# Patient Record
Sex: Female | Born: 1950 | Race: White | Hispanic: No | Marital: Married | Smoking: Former smoker
Health system: Southern US, Community
[De-identification: ages and names within clinical notes are randomized; demographics above are authoritative.]

## PROBLEM LIST (undated history)

## (undated) DIAGNOSIS — I639 Cerebral infarction, unspecified: Secondary | ICD-10-CM

## (undated) DIAGNOSIS — E785 Hyperlipidemia, unspecified: Secondary | ICD-10-CM

## (undated) DIAGNOSIS — I10 Essential (primary) hypertension: Secondary | ICD-10-CM

## (undated) DIAGNOSIS — H34219 Partial retinal artery occlusion, unspecified eye: Secondary | ICD-10-CM

## (undated) DIAGNOSIS — M5416 Radiculopathy, lumbar region: Secondary | ICD-10-CM

## (undated) DIAGNOSIS — I2510 Atherosclerotic heart disease of native coronary artery without angina pectoris: Secondary | ICD-10-CM

## (undated) DIAGNOSIS — M8580 Other specified disorders of bone density and structure, unspecified site: Secondary | ICD-10-CM

## (undated) DIAGNOSIS — N183 Chronic kidney disease, stage 3 unspecified: Secondary | ICD-10-CM

## (undated) DIAGNOSIS — M353 Polymyalgia rheumatica: Secondary | ICD-10-CM

## (undated) DIAGNOSIS — B029 Zoster without complications: Secondary | ICD-10-CM

## (undated) HISTORY — DX: Essential (primary) hypertension: I10

## (undated) HISTORY — DX: Other specified disorders of bone density and structure, unspecified site: M85.80

## (undated) HISTORY — DX: Cerebral infarction, unspecified: I63.9

## (undated) HISTORY — DX: Atherosclerotic heart disease of native coronary artery without angina pectoris: I25.10

## (undated) HISTORY — DX: Radiculopathy, lumbar region: M54.16

## (undated) HISTORY — DX: Partial retinal artery occlusion, unspecified eye: H34.219

## (undated) HISTORY — DX: Polymyalgia rheumatica: M35.3

## (undated) HISTORY — DX: Chronic kidney disease, stage 3 (moderate): N18.3

## (undated) HISTORY — DX: Zoster without complications: B02.9

## (undated) HISTORY — DX: Hyperlipidemia, unspecified: E78.5

## (undated) HISTORY — DX: Chronic kidney disease, stage 3 unspecified: N18.30

---

## 1998-05-05 ENCOUNTER — Other Ambulatory Visit: Admission: RE | Admit: 1998-05-05 | Discharge: 1998-05-05 | Payer: Self-pay | Admitting: *Deleted

## 1998-05-08 ENCOUNTER — Ambulatory Visit (HOSPITAL_COMMUNITY): Admission: RE | Admit: 1998-05-08 | Discharge: 1998-05-08 | Payer: Self-pay | Admitting: Gastroenterology

## 1998-05-22 ENCOUNTER — Other Ambulatory Visit: Admission: RE | Admit: 1998-05-22 | Discharge: 1998-05-22 | Payer: Self-pay | Admitting: *Deleted

## 1998-05-25 ENCOUNTER — Inpatient Hospital Stay (HOSPITAL_COMMUNITY): Admission: RE | Admit: 1998-05-25 | Discharge: 1998-05-27 | Payer: Self-pay | Admitting: *Deleted

## 1998-12-19 ENCOUNTER — Emergency Department (HOSPITAL_COMMUNITY): Admission: EM | Admit: 1998-12-19 | Discharge: 1998-12-19 | Payer: Self-pay | Admitting: Emergency Medicine

## 1998-12-23 ENCOUNTER — Ambulatory Visit (HOSPITAL_COMMUNITY): Admission: RE | Admit: 1998-12-23 | Discharge: 1998-12-23 | Payer: Self-pay | Admitting: Gastroenterology

## 1999-08-03 ENCOUNTER — Encounter: Payer: Self-pay | Admitting: Family Medicine

## 1999-08-03 ENCOUNTER — Ambulatory Visit (HOSPITAL_COMMUNITY): Admission: RE | Admit: 1999-08-03 | Discharge: 1999-08-03 | Payer: Self-pay | Admitting: Family Medicine

## 1999-10-08 ENCOUNTER — Other Ambulatory Visit: Admission: RE | Admit: 1999-10-08 | Discharge: 1999-10-08 | Payer: Self-pay | Admitting: *Deleted

## 1999-10-12 ENCOUNTER — Encounter (INDEPENDENT_AMBULATORY_CARE_PROVIDER_SITE_OTHER): Payer: Self-pay | Admitting: *Deleted

## 1999-10-12 ENCOUNTER — Ambulatory Visit (HOSPITAL_COMMUNITY): Admission: RE | Admit: 1999-10-12 | Discharge: 1999-10-12 | Payer: Self-pay | Admitting: Gastroenterology

## 2000-09-19 ENCOUNTER — Encounter: Admission: RE | Admit: 2000-09-19 | Discharge: 2000-09-19 | Payer: Self-pay | Admitting: Gastroenterology

## 2000-09-19 ENCOUNTER — Encounter: Payer: Self-pay | Admitting: Gastroenterology

## 2000-12-16 ENCOUNTER — Ambulatory Visit (HOSPITAL_COMMUNITY): Admission: RE | Admit: 2000-12-16 | Discharge: 2000-12-16 | Payer: Self-pay | Admitting: Neurology

## 2000-12-16 ENCOUNTER — Encounter: Payer: Self-pay | Admitting: Neurology

## 2001-06-29 ENCOUNTER — Other Ambulatory Visit: Admission: RE | Admit: 2001-06-29 | Discharge: 2001-06-29 | Payer: Self-pay | Admitting: *Deleted

## 2002-05-09 ENCOUNTER — Encounter: Payer: Self-pay | Admitting: Neurology

## 2002-05-09 ENCOUNTER — Encounter: Admission: RE | Admit: 2002-05-09 | Discharge: 2002-05-09 | Payer: Self-pay | Admitting: Neurology

## 2009-02-28 ENCOUNTER — Emergency Department (HOSPITAL_COMMUNITY): Admission: EM | Admit: 2009-02-28 | Discharge: 2009-02-28 | Payer: Self-pay | Admitting: Emergency Medicine

## 2010-02-25 ENCOUNTER — Other Ambulatory Visit: Admission: RE | Admit: 2010-02-25 | Discharge: 2010-02-25 | Payer: Self-pay | Admitting: Obstetrics and Gynecology

## 2010-10-01 ENCOUNTER — Emergency Department (HOSPITAL_COMMUNITY): Admission: EM | Admit: 2010-10-01 | Discharge: 2010-10-01 | Payer: Self-pay | Admitting: Emergency Medicine

## 2011-02-18 ENCOUNTER — Other Ambulatory Visit: Payer: Self-pay | Admitting: Gastroenterology

## 2011-03-17 LAB — COMPREHENSIVE METABOLIC PANEL WITH GFR
ALT: 22 U/L (ref 0–35)
AST: 23 U/L (ref 0–37)
Albumin: 4.2 g/dL (ref 3.5–5.2)
CO2: 25 meq/L (ref 19–32)
Calcium: 9.7 mg/dL (ref 8.4–10.5)
Chloride: 96 meq/L (ref 96–112)
GFR calc Af Amer: >60 mL/min (ref >60)
GFR calc non Af Amer: >60 mL/min (ref >60)
Sodium: 130 meq/L — ABNORMAL LOW (ref 135–145)

## 2011-03-17 LAB — DIFFERENTIAL
Basophils Absolute: 0 10*3/uL (ref 0.0–0.1)
Basophils Relative: 0 % (ref 0–1)
Eosinophils Absolute: 0.1 K/uL (ref 0.0–0.7)
Eosinophils Relative: 1 % (ref 0–5)
Lymphocytes Relative: 15 % (ref 12–46)
Lymphs Abs: 1.9 K/uL (ref 0.7–4.0)
Monocytes Absolute: 0.6 K/uL (ref 0.1–1.0)
Monocytes Relative: 5 % (ref 3–12)
Neutro Abs: 9.9 10*3/uL — ABNORMAL HIGH (ref 1.7–7.7)
Neutrophils Relative %: 79 % — ABNORMAL HIGH (ref 43–77)

## 2011-03-17 LAB — GLUCOSE, CAPILLARY: Glucose-Capillary: 194 mg/dL — ABNORMAL HIGH (ref 70–99)

## 2011-03-17 LAB — CBC
HCT: 45.6 % (ref 36.0–46.0)
Hemoglobin: 15.4 g/dL — ABNORMAL HIGH (ref 12.0–15.0)
MCHC: 33.7 g/dL (ref 30.0–36.0)
MCV: 94.5 fL (ref 78.0–100.0)
Platelets: 313 10*3/uL (ref 150–400)
RBC: 4.82 MIL/uL (ref 3.87–5.11)
RDW: 13.3 % (ref 11.5–15.5)
WBC: 12.5 K/uL — ABNORMAL HIGH (ref 4.0–10.5)

## 2011-03-17 LAB — COMPREHENSIVE METABOLIC PANEL
Alkaline Phosphatase: 80 U/L (ref 39–117)
BUN: 9 mg/dL (ref 6–23)
Creatinine, Ser: 0.65 mg/dL (ref 0.4–1.2)
Glucose, Bld: 169 mg/dL — ABNORMAL HIGH (ref 70–99)
Potassium: 3.9 mEq/L (ref 3.5–5.1)
Total Bilirubin: 0.8 mg/dL (ref 0.3–1.2)
Total Protein: 7.5 g/dL (ref 6.0–8.3)

## 2011-03-17 LAB — URINALYSIS, ROUTINE W REFLEX MICROSCOPIC
Bilirubin Urine: NEGATIVE
Glucose, UA: NEGATIVE mg/dL
Hgb urine dipstick: NEGATIVE
Ketones, ur: NEGATIVE mg/dL
Nitrite: NEGATIVE
Protein, ur: NEGATIVE mg/dL
Specific Gravity, Urine: 1.007 (ref 1.005–1.030)
Urobilinogen, UA: 0.2 mg/dL (ref 0.0–1.0)
pH: 7.5 (ref 5.0–8.0)

## 2011-03-17 LAB — LIPASE, BLOOD: Lipase: 10 U/L — ABNORMAL LOW (ref 11–59)

## 2011-04-22 NOTE — Procedures (Signed)
[place]  Patient:    Yolanda Lopez                  MRN: [id number] Proc. Date: 10/12/99 Adm. Date:  [id number] Attending:  Rich Brave CC:         Stacie Acres. Cliffton Asters, M.D.                           Procedure Report  PROCEDURE PERFORMED:  Colonoscopy with biopsies.  ENDOSCOPIST:  Florencia Reasons, M.D.  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  FINDINGS:  Mild proctitis present.  DESCRIPTION OF PROCEDURE:  The nature, purpose and risks of the procedure were familiar to the patient who provided written consent.  Sedation was fentanyl 100 mcg and Versed 14 mg IV without arrhythmias or desaturation.  The Olympus PCF 140L pediatric video colonoscope was inserted and advanced with some difficulty through an angulated rectosigmoid region and from  there quite easily around the colon to the cecum, experiencing some looping which was overcome by external abdominal compression.  The cecum was reached and brief attempts to enter the terminal ileum were unsuccessful so pull-back was then performed.  The quality of the prep was excellent, so it is felt that all areas were well seen. The only abnormality on this exam was mild proctitis up to about 23 cm, characterized by loss of vascularity and mild mucosal edema and granularity, but no exudate, no pinpoint hemorrhages, no significant erythema.  Biopsies were obtained from the inflamed segment.  Above 23 cm, colonic mucosa had a completely normal  appearance, with a good vascular pattern.  No polyps, cancer or proximal colitis were observed.  No vascular malformations were seen.  The patient tolerated the procedure well and there were no apparent complications. Retroflexion was not performed in the rectum.  IMPRESSION:  Mild  active proctitis.  PLAN:  Await pathology on the biopsies. DD:  10/12/99 TD:  10/13/99 Job: [id number]

## 2013-06-24 ENCOUNTER — Other Ambulatory Visit: Payer: Self-pay | Admitting: Pain Medicine

## 2013-06-24 DIAGNOSIS — M542 Cervicalgia: Secondary | ICD-10-CM

## 2013-07-23 ENCOUNTER — Other Ambulatory Visit: Payer: Self-pay

## 2013-07-30 ENCOUNTER — Other Ambulatory Visit: Payer: Self-pay

## 2013-08-16 ENCOUNTER — Other Ambulatory Visit: Payer: Self-pay | Admitting: Neurology

## 2013-08-16 DIAGNOSIS — G902 Horner's syndrome: Secondary | ICD-10-CM

## 2013-08-22 ENCOUNTER — Ambulatory Visit
Admission: RE | Admit: 2013-08-22 | Discharge: 2013-08-22 | Disposition: A | Discharge status: Home / Self Care | Payer: BC Managed Care – PPO | Source: Ambulatory Visit | Attending: Neurology | Admitting: Neurology

## 2013-08-22 DIAGNOSIS — G902 Horner's syndrome: Secondary | ICD-10-CM

## 2014-02-17 ENCOUNTER — Other Ambulatory Visit: Payer: Self-pay | Admitting: Gastroenterology

## 2014-07-26 ENCOUNTER — Encounter: Payer: Self-pay | Admitting: *Deleted

## 2014-09-09 ENCOUNTER — Other Ambulatory Visit: Payer: Self-pay | Admitting: Family Medicine

## 2014-09-09 ENCOUNTER — Ambulatory Visit
Admission: RE | Admit: 2014-09-09 | Discharge: 2014-09-09 | Disposition: A | Discharge status: Home / Self Care | Payer: BC Managed Care – PPO | Source: Ambulatory Visit | Attending: Family Medicine | Admitting: Family Medicine

## 2014-09-09 DIAGNOSIS — J8410 Pulmonary fibrosis, unspecified: Secondary | ICD-10-CM

## 2014-11-13 ENCOUNTER — Telehealth: Payer: Self-pay | Admitting: *Deleted

## 2014-11-13 NOTE — Telephone Encounter (Signed)
[facility] call from [facility] at [facility] asking for verbal order for pt to have a ten-electrosimulator for her low back pain. I went and spoke to Dr. Tanya NonesPickard in reference to this and he has agreed to have it done. [place].

## 2015-05-21 DIAGNOSIS — M791 Myalgia: Secondary | ICD-10-CM | POA: Diagnosis not present

## 2015-06-01 DIAGNOSIS — M797 Fibromyalgia: Secondary | ICD-10-CM | POA: Diagnosis not present

## 2015-06-01 DIAGNOSIS — R109 Unspecified abdominal pain: Secondary | ICD-10-CM | POA: Diagnosis not present

## 2015-06-01 DIAGNOSIS — Z79899 Other long term (current) drug therapy: Secondary | ICD-10-CM | POA: Diagnosis not present

## 2015-06-01 DIAGNOSIS — G894 Chronic pain syndrome: Secondary | ICD-10-CM | POA: Diagnosis not present

## 2015-06-01 DIAGNOSIS — M1990 Unspecified osteoarthritis, unspecified site: Secondary | ICD-10-CM | POA: Diagnosis not present

## 2015-06-01 DIAGNOSIS — M542 Cervicalgia: Secondary | ICD-10-CM | POA: Diagnosis not present

## 2015-06-19 DIAGNOSIS — M791 Myalgia: Secondary | ICD-10-CM | POA: Diagnosis not present

## 2015-06-25 DIAGNOSIS — Z1231 Encounter for screening mammogram for malignant neoplasm of breast: Secondary | ICD-10-CM | POA: Diagnosis not present

## 2015-06-25 DIAGNOSIS — Z01419 Encounter for gynecological examination (general) (routine) without abnormal findings: Secondary | ICD-10-CM | POA: Diagnosis not present

## 2015-06-25 DIAGNOSIS — Z124 Encounter for screening for malignant neoplasm of cervix: Secondary | ICD-10-CM | POA: Diagnosis not present

## 2015-07-10 DIAGNOSIS — K5130 Ulcerative (chronic) rectosigmoiditis without complications: Secondary | ICD-10-CM | POA: Diagnosis not present

## 2015-07-16 DIAGNOSIS — M810 Age-related osteoporosis without current pathological fracture: Secondary | ICD-10-CM | POA: Diagnosis not present

## 2015-07-16 DIAGNOSIS — Z780 Asymptomatic menopausal state: Secondary | ICD-10-CM | POA: Diagnosis not present

## 2015-07-27 DIAGNOSIS — G894 Chronic pain syndrome: Secondary | ICD-10-CM | POA: Diagnosis not present

## 2015-07-27 DIAGNOSIS — M1990 Unspecified osteoarthritis, unspecified site: Secondary | ICD-10-CM | POA: Diagnosis not present

## 2015-07-27 DIAGNOSIS — M542 Cervicalgia: Secondary | ICD-10-CM | POA: Diagnosis not present

## 2015-07-27 DIAGNOSIS — R109 Unspecified abdominal pain: Secondary | ICD-10-CM | POA: Diagnosis not present

## 2015-07-27 DIAGNOSIS — Z79899 Other long term (current) drug therapy: Secondary | ICD-10-CM | POA: Diagnosis not present

## 2015-07-27 DIAGNOSIS — M797 Fibromyalgia: Secondary | ICD-10-CM | POA: Diagnosis not present

## 2015-09-08 DIAGNOSIS — K3184 Gastroparesis: Secondary | ICD-10-CM | POA: Diagnosis not present

## 2015-09-08 DIAGNOSIS — K5130 Ulcerative (chronic) rectosigmoiditis without complications: Secondary | ICD-10-CM | POA: Diagnosis not present

## 2015-09-08 DIAGNOSIS — K219 Gastro-esophageal reflux disease without esophagitis: Secondary | ICD-10-CM | POA: Diagnosis not present

## 2015-09-16 DIAGNOSIS — Z4789 Encounter for other orthopedic aftercare: Secondary | ICD-10-CM | POA: Diagnosis not present

## 2015-09-16 DIAGNOSIS — M1811 Unilateral primary osteoarthritis of first carpometacarpal joint, right hand: Secondary | ICD-10-CM | POA: Diagnosis not present

## 2015-09-16 DIAGNOSIS — M180 Bilateral primary osteoarthritis of first carpometacarpal joints: Secondary | ICD-10-CM | POA: Diagnosis not present

## 2015-10-13 DIAGNOSIS — E559 Vitamin D deficiency, unspecified: Secondary | ICD-10-CM | POA: Diagnosis not present

## 2015-10-27 ENCOUNTER — Emergency Department (HOSPITAL_COMMUNITY)
Admission: EM | Admit: 2015-10-27 | Discharge: 2015-10-27 | Disposition: A | Discharge status: Home / Self Care | Payer: Medicare Other | Attending: Emergency Medicine | Admitting: Emergency Medicine

## 2015-10-27 ENCOUNTER — Emergency Department (HOSPITAL_COMMUNITY): Payer: Medicare Other

## 2015-10-27 ENCOUNTER — Encounter (HOSPITAL_COMMUNITY): Payer: Self-pay | Admitting: Emergency Medicine

## 2015-10-27 DIAGNOSIS — Y998 Other external cause status: Secondary | ICD-10-CM | POA: Insufficient documentation

## 2015-10-27 DIAGNOSIS — Y92009 Unspecified place in unspecified non-institutional (private) residence as the place of occurrence of the external cause: Secondary | ICD-10-CM | POA: Diagnosis not present

## 2015-10-27 DIAGNOSIS — Z8619 Personal history of other infectious and parasitic diseases: Secondary | ICD-10-CM | POA: Diagnosis not present

## 2015-10-27 DIAGNOSIS — E785 Hyperlipidemia, unspecified: Secondary | ICD-10-CM | POA: Diagnosis not present

## 2015-10-27 DIAGNOSIS — W11XXXA Fall on and from ladder, initial encounter: Secondary | ICD-10-CM | POA: Insufficient documentation

## 2015-10-27 DIAGNOSIS — Z8669 Personal history of other diseases of the nervous system and sense organs: Secondary | ICD-10-CM | POA: Diagnosis not present

## 2015-10-27 DIAGNOSIS — Z87891 Personal history of nicotine dependence: Secondary | ICD-10-CM | POA: Diagnosis not present

## 2015-10-27 DIAGNOSIS — M25571 Pain in right ankle and joints of right foot: Secondary | ICD-10-CM | POA: Diagnosis not present

## 2015-10-27 DIAGNOSIS — Z79899 Other long term (current) drug therapy: Secondary | ICD-10-CM | POA: Diagnosis not present

## 2015-10-27 DIAGNOSIS — I129 Hypertensive chronic kidney disease with stage 1 through stage 4 chronic kidney disease, or unspecified chronic kidney disease: Secondary | ICD-10-CM | POA: Diagnosis not present

## 2015-10-27 DIAGNOSIS — N183 Chronic kidney disease, stage 3 (moderate): Secondary | ICD-10-CM | POA: Insufficient documentation

## 2015-10-27 DIAGNOSIS — M8580 Other specified disorders of bone density and structure, unspecified site: Secondary | ICD-10-CM | POA: Insufficient documentation

## 2015-10-27 DIAGNOSIS — Y9389 Activity, other specified: Secondary | ICD-10-CM | POA: Diagnosis not present

## 2015-10-27 DIAGNOSIS — S8261XA Displaced fracture of lateral malleolus of right fibula, initial encounter for closed fracture: Secondary | ICD-10-CM | POA: Diagnosis not present

## 2015-10-27 DIAGNOSIS — S82401A Unspecified fracture of shaft of right fibula, initial encounter for closed fracture: Secondary | ICD-10-CM

## 2015-10-27 DIAGNOSIS — I2510 Atherosclerotic heart disease of native coronary artery without angina pectoris: Secondary | ICD-10-CM | POA: Insufficient documentation

## 2015-10-27 DIAGNOSIS — S99911A Unspecified injury of right ankle, initial encounter: Secondary | ICD-10-CM | POA: Diagnosis present

## 2015-10-27 MED ORDER — ACETAMINOPHEN 500 MG PO TABS
1000.0000 mg | ORAL_TABLET | Freq: Once | ORAL | Status: AC
  Administered 2015-10-27: 1000 mg via ORAL
  Filled 2015-10-27: qty 2

## 2015-10-27 MED ORDER — HYDROCODONE-ACETAMINOPHEN 5-325 MG PO TABS: 1.0000 | ORAL_TABLET | Freq: Four times a day (QID) | ORAL | Status: AC | PRN

## 2015-10-27 NOTE — ED Notes (Signed)
Per EMS: Pt had fall from about 6-7 stairs, landing on right ankle, has swelling and slight deformity to ankle.

## 2015-10-27 NOTE — ED Notes (Signed)
Bed: [id number] Expected date:  Expected time:  Means of arrival:  Comments: EMS 64 yo female from home-fell down stairs-deformity right ankle/Fentanyl IV per EMS

## 2015-10-27 NOTE — Discharge Instructions (Signed)
Please follow up with Orthopedics. You may weight bear as tolerated.  Fibular Ankle Fracture Treated With or Without Immobilization, Adult A fibular fracture at your ankle is a break (fracture) bone in the smallest of the two bones in your lower leg, located on the outside of your leg (fibula) close to the area at your ankle joint. CAUSES  Rolling your ankle.  Twisting your ankle.  Extreme flexing or extending of your foot.  Severe force on your ankle as when falling from a distance. RISK FACTORS  Jumping activities.  Participation in sports.  Osteoporosis.  Advanced age.  Previous ankle injuries. SIGNS AND SYMPTOMS  Pain.  Swelling.  Inability to put weight on injured ankle.  Bruising.  Bone deformities at site of injury. DIAGNOSIS  This fracture is diagnosed with the help of an X-ray exam. TREATMENT  If the fractured bone did not move out of place it usually will heal without problems and does casting or splinting. If immobilization is needed for comfort or the fractured bone moved out of place and will not heal properly with immobilization, a cast or splint will be used. HOME CARE INSTRUCTIONS   Apply ice to the area of injury:  Put ice in a plastic bag.  Place a towel between your skin and the bag.  Leave the ice on for 20 minutes, 2-3 times a day.  Use crutches as directed. Resume walking without crutches as directed by your health care provider.  Only take over-the-counter or prescription medicines for pain, discomfort, or fever as directed by your health care provider.  If you have a removable splint or boot, do not remove the boot unless directed by your health care provider. SEEK MEDICAL CARE IF:   You have continued pain or more swelling  The medications do not control the pain. SEEK IMMEDIATE MEDICAL CARE IF:  You develop severe pain in the leg or foot.  Your skin or nails below the injury turn blue or grey or feel cold or numb. MAKE SURE  YOU:   Understand these instructions.  Will watch your condition.  Will get help right away if you are not doing well or get worse.   This information is not intended to replace advice given to you by your health care provider. Make sure you discuss any questions you have with your health care provider.   Document Released: 11/21/2005 Document Revised: 12/12/2014 Document Reviewed: 07/03/2013 Elsevier Interactive Patient Education 2016 [employer] RICE for Routine Care of Injuries Theroutine careofmanyinjuriesincludes rest, ice, compression, and elevation (RICE therapy). RICE therapy is often recommended for injuries to soft tissues, such as a muscle strain, ligament injuries, bruises, and overuse injuries. It can also be used for some bony injuries. Using RICE therapy can help to relieve pain, lessen swelling, and enable your body to heal. Rest Rest is required to allow your body to heal. This usually involves reducing your normal activities and avoiding use of the injured part of your body. Generally, you can return to your normal activities when you are comfortable and have been given permission by your health care provider. Ice Icing your injury helps to keep the swelling down, and it lessens pain. Do not apply ice directly to your skin. Put ice in a plastic bag. Place a towel between your skin and the bag. Leave the ice on for 20 minutes, 2-3 times a day. Do this for as long as you are directed by your health care provider. Compression Compression means putting pressure on  the injured area. Compression helps to keep swelling down, gives support, and helps with discomfort. Compression may be done with an elastic bandage. If an elastic bandage has been applied, follow these general tips: Remove and reapply the bandage every 3-4 hours or as directed by your health care provider. Make sure the bandage is not wrapped too tightly, because this can cut off circulation. If part of your  body beyond the bandage becomes blue, numb, cold, swollen, or more painful, your bandage is most likely too tight. If this occurs, remove your bandage and reapply it more loosely. See your health care provider if the bandage seems to be making your problems worse rather than better. Elevation Elevation means keeping the injured area raised. This helps to lessen swelling and decrease pain. If possible, your injured area should be elevated at or above the level of your heart or the center of your chest. WHEN SHOULD I SEEK MEDICAL CARE? You should seek medical care if: Your pain and swelling continue. Your symptoms are getting worse rather than improving. These symptoms may indicate that further evaluation or further X-rays are needed. Sometimes, X-rays may not show a small broken bone (fracture) until a number of days later. Make a follow-up appointment with your health care provider. WHEN SHOULD I SEEK IMMEDIATE MEDICAL CARE? You should seek immediate medical care if: You have sudden severe pain at or below the area of your injury. You have redness or increased swelling around your injury. You have tingling or numbness at or below the area of your injury that does not improve after you remove the elastic bandage.   This information is not intended to replace advice given to you by your health care provider. Make sure you discuss any questions you have with your health care provider.   Document Released: 03/05/2001 Document Revised: 08/12/2015 Document Reviewed: 10/29/2014 Elsevier Interactive Patient Education [employer]

## 2015-10-27 NOTE — ED Provider Notes (Signed)
CSN: [id number]     Arrival date & time 10/27/15  1941 History   First MD Initiated Contact with Patient 10/27/15 1952     Chief Complaint  Patient presents with  . Ankle Injury    right     (Consider location/radiation/quality/duration/timing/severity/associated sxs/prior Treatment) HPI Yolanda Lopez is a(n) 64 y.o. female who presents to the emergency department with chief complaint of right ankle pain. Patient states that she was going up a drop down attic ladder when she lost her footing. Her shoe got caught in the ladder and she fell backward dangling from the right ankle. She denies hitting her head or losing consciousness. She immediate severe pain in the ankle. Unable to bear weight. She denies any numbness, tingling. She has previous injuries to this area.  Past Medical History  Diagnosis Date  . Essential hypertension, benign   . CAD (coronary artery disease)   . Other and unspecified hyperlipidemia   . Hollenhorst plaque   . Shingles   . PMR (polymyalgia rheumatica) (HCC)   . BPH (benign prostatic hypertrophy)   . ED (erectile dysfunction)   . Lumbar radiculopathy   . Osteopenia   . CKD (chronic kidney disease), stage III   . Occipital stroke [facility](HCC)    History reviewed. No pertinent past surgical history. History reviewed. No pertinent family history. Social History  Substance Use Topics  . Smoking status: Former Games developermoker  . Smokeless tobacco: None     Comment: quit 1965  . Alcohol Use: Yes     Comment: occassionally   OB History    No data available     Review of Systems   Ten systems reviewed and are negative for acute change, except as noted in the HPI.   Allergies  Voltaren; Adhesive; Codeine; Meloxicam; Norvasc; Other; Prinivil; and Vioxx  Home Medications   Prior to Admission medications   Medication Sig Start Date End Date Taking? Authorizing Provider  benazepril (LOTENSIN) 40 MG tablet Take 40 mg by mouth daily.    Historical Provider, MD   Cholecalciferol (VITAMIN D3) 5000 UNITS CAPS Take 1 capsule by mouth daily.    Historical Provider, MD  colesevelam (WELCHOL) 625 MG tablet Take 1,875 mg by mouth 2 (two) times daily with a meal.    Historical Provider, MD  dexlansoprazole (DEXILANT) 60 MG capsule Take 60 mg by mouth daily.    Historical Provider, MD  DULoxetine (CYMBALTA) 60 MG capsule Take 60 mg by mouth daily.    Historical Provider, MD  glimepiride (AMARYL) 4 MG tablet Take 4 mg by mouth daily with breakfast.    Historical Provider, MD  Magnesium 100 MG TABS Take 1 tablet by mouth daily.    Historical Provider, MD  Mesalamine (DELZICOL) 400 MG CPDR DR capsule Take by mouth. Take 6 capsules by mouth daily    Historical Provider, MD  pyridOXINE (VITAMIN B-6) 100 MG tablet Take 100 mg by mouth daily.    Historical Provider, MD  saxagliptin HCl (ONGLYZA) 5 MG TABS tablet Take 5 mg by mouth daily.    Historical Provider, MD  Tapentadol HCl (NUCYNTA) 100 MG TABS Take 1 tablet by mouth 2 (two) times daily.    Historical Provider, MD  valACYclovir (VALTREX) 1000 MG tablet Take 1,000 mg by mouth 2 (two) times daily.    Historical Provider, MD  vitamin B-12 (CYANOCOBALAMIN) 1000 MCG tablet Take 1,000 mcg by mouth daily.    Historical Provider, MD   BP 132/71 mmHg  Pulse 97  Temp(Src) 98.3 F (36.8 C) (Oral)  Resp 20  SpO2 98% Physical Exam Physical Exam  Nursing note and vitals reviewed. Constitutional: She is oriented to person, place, and time. She appears well-developed and well-nourished. No distress.  HENT:  Head: Normocephalic and atraumatic.  Eyes: Conjunctivae normal and EOM are normal. Pupils are equal, round, and reactive to light. No scleral icterus.  Neck: Normal range of motion.  Cardiovascular: Normal rate, regular rhythm and normal heart sounds.  Exam reveals no gallop and no friction rub.   No murmur heard. Pulmonary/Chest: Effort normal and breath sounds normal. No respiratory distress.  Abdominal: Soft.  Bowel sounds are normal. She exhibits no distension and no mass. There is no tenderness. There is no guarding.  Neurological: She is alert and oriented to person, place, and time. Musculoskeletal: patient with tenderness to palpation and swelling in the R ankle. NVI. Limited ROM. R knee mildle tender with Flexion and extension. Ligaments stable.  Skin: Skin is warm and dry. She is not diaphoretic.    ED Course  Procedures (including critical care time) Labs Review Labs Reviewed - No data to display  Imaging Review Dg Ankle Complete Right  10/27/2015  CLINICAL DATA:  Pt states she was trying to put stuff away in attic today and fell. Right ankle pain/swelling to medial and lateral right ankle. EXAM: RIGHT ANKLE - COMPLETE 3+ VIEW COMPARISON:  None. FINDINGS: Transverse fracture of the lateral malleolus, distracted up to 5 mm, with overlying soft tissue swelling. Ankle mortise is intact. Medial and posterior malleoli appear intact. Otherwise normal mineralization and alignment. No significant osseous degenerative change. IMPRESSION: 1. Minimally distracted lateral malleolar fracture. Electronically Signed   By: Corlis Leak M.D.   On: 10/27/2015 20:18   I have personally reviewed and evaluated these images and lab results as part of my medical decision-making.   EKG Interpretation None      MDM   Final diagnoses:  Fibula fracture, right, closed, initial encounter    Patient with minimally displaed Lateral Malleloar fracture.  Treat with Cam walker and cruthces. F/U with ortho,. I have discussed the case with Dr. Juleen China. WBAT ,Apply a compressive ACE bandage. Rest and elevate the affected painful area.  Apply cold compresses intermittently as needed.  As pain recedes, begin normal activities slowly as tolerated.  Call if symptoms persist. Discussed return precautions        Arthor Captain, PA-C 10/27/15 2149  Raeford Razor, MD 11/07/15 2048

## 2015-10-28 DIAGNOSIS — W11XXXA Fall on and from ladder, initial encounter: Secondary | ICD-10-CM | POA: Diagnosis not present

## 2015-10-28 DIAGNOSIS — S82891D Other fracture of right lower leg, subsequent encounter for closed fracture with routine healing: Secondary | ICD-10-CM | POA: Diagnosis not present

## 2015-11-04 DIAGNOSIS — S8291XA Unspecified fracture of right lower leg, initial encounter for closed fracture: Secondary | ICD-10-CM | POA: Diagnosis not present

## 2015-11-04 DIAGNOSIS — M79671 Pain in right foot: Secondary | ICD-10-CM | POA: Diagnosis not present

## 2015-11-05 ENCOUNTER — Other Ambulatory Visit: Payer: Self-pay | Admitting: Specialist

## 2015-11-05 ENCOUNTER — Telehealth: Payer: Self-pay | Admitting: *Deleted

## 2015-11-06 ENCOUNTER — Ambulatory Visit
Admission: RE | Admit: 2015-11-06 | Discharge: 2015-11-06 | Disposition: A | Discharge status: Home / Self Care | Payer: Medicare Other | Source: Ambulatory Visit | Attending: Specialist | Admitting: Specialist

## 2015-11-06 ENCOUNTER — Other Ambulatory Visit: Payer: Self-pay | Admitting: Specialist

## 2015-11-06 DIAGNOSIS — S8261XS Displaced fracture of lateral malleolus of right fibula, sequela: Secondary | ICD-10-CM

## 2015-11-06 DIAGNOSIS — S92191A Other fracture of right talus, initial encounter for closed fracture: Secondary | ICD-10-CM | POA: Diagnosis not present

## 2015-11-06 DIAGNOSIS — S8261XA Displaced fracture of lateral malleolus of right fibula, initial encounter for closed fracture: Secondary | ICD-10-CM | POA: Diagnosis not present

## 2015-11-06 DIAGNOSIS — R937 Abnormal findings on diagnostic imaging of other parts of musculoskeletal system: Secondary | ICD-10-CM | POA: Diagnosis not present

## 2015-11-09 DIAGNOSIS — S8261XD Displaced fracture of lateral malleolus of right fibula, subsequent encounter for closed fracture with routine healing: Secondary | ICD-10-CM | POA: Diagnosis not present

## 2015-11-09 DIAGNOSIS — S92111A Displaced fracture of neck of right talus, initial encounter for closed fracture: Secondary | ICD-10-CM | POA: Diagnosis not present

## 2015-11-10 DIAGNOSIS — Y92016 Swimming-pool in single-family (private) house or garden as the place of occurrence of the external cause: Secondary | ICD-10-CM | POA: Diagnosis not present

## 2015-11-10 DIAGNOSIS — S8263XA Displaced fracture of lateral malleolus of unspecified fibula, initial encounter for closed fracture: Secondary | ICD-10-CM | POA: Diagnosis not present

## 2015-11-10 DIAGNOSIS — S93311A Subluxation of tarsal joint of right foot, initial encounter: Secondary | ICD-10-CM | POA: Diagnosis not present

## 2015-11-10 DIAGNOSIS — W19XXXA Unspecified fall, initial encounter: Secondary | ICD-10-CM | POA: Diagnosis not present

## 2015-11-10 DIAGNOSIS — S8261XA Displaced fracture of lateral malleolus of right fibula, initial encounter for closed fracture: Secondary | ICD-10-CM | POA: Diagnosis not present

## 2015-11-10 DIAGNOSIS — S9301XA Subluxation of right ankle joint, initial encounter: Secondary | ICD-10-CM | POA: Diagnosis not present

## 2015-11-10 DIAGNOSIS — Y998 Other external cause status: Secondary | ICD-10-CM | POA: Diagnosis not present

## 2015-11-10 DIAGNOSIS — G8918 Other acute postprocedural pain: Secondary | ICD-10-CM | POA: Diagnosis not present

## 2015-11-10 DIAGNOSIS — S92111A Displaced fracture of neck of right talus, initial encounter for closed fracture: Secondary | ICD-10-CM | POA: Diagnosis not present

## 2015-11-16 DIAGNOSIS — S92114D Nondisplaced fracture of neck of right talus, subsequent encounter for fracture with routine healing: Secondary | ICD-10-CM | POA: Diagnosis not present

## 2015-11-16 DIAGNOSIS — Z7984 Long term (current) use of oral hypoglycemic drugs: Secondary | ICD-10-CM | POA: Diagnosis not present

## 2015-11-16 DIAGNOSIS — S8261XD Displaced fracture of lateral malleolus of right fibula, subsequent encounter for closed fracture with routine healing: Secondary | ICD-10-CM | POA: Diagnosis not present

## 2015-11-16 DIAGNOSIS — M6281 Muscle weakness (generalized): Secondary | ICD-10-CM | POA: Diagnosis not present

## 2015-11-16 DIAGNOSIS — W1789XD Other fall from one level to another, subsequent encounter: Secondary | ICD-10-CM | POA: Diagnosis not present

## 2015-11-16 DIAGNOSIS — Z79891 Long term (current) use of opiate analgesic: Secondary | ICD-10-CM | POA: Diagnosis not present

## 2015-11-19 DIAGNOSIS — S92114D Nondisplaced fracture of neck of right talus, subsequent encounter for fracture with routine healing: Secondary | ICD-10-CM | POA: Diagnosis not present

## 2015-11-19 DIAGNOSIS — M6281 Muscle weakness (generalized): Secondary | ICD-10-CM | POA: Diagnosis not present

## 2015-11-19 DIAGNOSIS — W1789XD Other fall from one level to another, subsequent encounter: Secondary | ICD-10-CM | POA: Diagnosis not present

## 2015-11-19 DIAGNOSIS — Z7984 Long term (current) use of oral hypoglycemic drugs: Secondary | ICD-10-CM | POA: Diagnosis not present

## 2015-11-19 DIAGNOSIS — S8261XD Displaced fracture of lateral malleolus of right fibula, subsequent encounter for closed fracture with routine healing: Secondary | ICD-10-CM | POA: Diagnosis not present

## 2015-11-19 DIAGNOSIS — Z79891 Long term (current) use of opiate analgesic: Secondary | ICD-10-CM | POA: Diagnosis not present

## 2015-11-25 DIAGNOSIS — S8261XD Displaced fracture of lateral malleolus of right fibula, subsequent encounter for closed fracture with routine healing: Secondary | ICD-10-CM | POA: Diagnosis not present

## 2015-11-25 DIAGNOSIS — S92111D Displaced fracture of neck of right talus, subsequent encounter for fracture with routine healing: Secondary | ICD-10-CM | POA: Diagnosis not present

## 2015-11-27 DIAGNOSIS — S8261XD Displaced fracture of lateral malleolus of right fibula, subsequent encounter for closed fracture with routine healing: Secondary | ICD-10-CM | POA: Diagnosis not present

## 2015-11-27 DIAGNOSIS — Z79891 Long term (current) use of opiate analgesic: Secondary | ICD-10-CM | POA: Diagnosis not present

## 2015-11-27 DIAGNOSIS — W1789XD Other fall from one level to another, subsequent encounter: Secondary | ICD-10-CM | POA: Diagnosis not present

## 2015-11-27 DIAGNOSIS — M6281 Muscle weakness (generalized): Secondary | ICD-10-CM | POA: Diagnosis not present

## 2015-11-27 DIAGNOSIS — S92114D Nondisplaced fracture of neck of right talus, subsequent encounter for fracture with routine healing: Secondary | ICD-10-CM | POA: Diagnosis not present

## 2015-11-27 DIAGNOSIS — Z7984 Long term (current) use of oral hypoglycemic drugs: Secondary | ICD-10-CM | POA: Diagnosis not present

## 2015-12-03 DIAGNOSIS — M6281 Muscle weakness (generalized): Secondary | ICD-10-CM | POA: Diagnosis not present

## 2015-12-03 DIAGNOSIS — Z79891 Long term (current) use of opiate analgesic: Secondary | ICD-10-CM | POA: Diagnosis not present

## 2015-12-03 DIAGNOSIS — Z7984 Long term (current) use of oral hypoglycemic drugs: Secondary | ICD-10-CM | POA: Diagnosis not present

## 2015-12-03 DIAGNOSIS — S8261XD Displaced fracture of lateral malleolus of right fibula, subsequent encounter for closed fracture with routine healing: Secondary | ICD-10-CM | POA: Diagnosis not present

## 2015-12-03 DIAGNOSIS — W1789XD Other fall from one level to another, subsequent encounter: Secondary | ICD-10-CM | POA: Diagnosis not present

## 2015-12-03 DIAGNOSIS — S92114D Nondisplaced fracture of neck of right talus, subsequent encounter for fracture with routine healing: Secondary | ICD-10-CM | POA: Diagnosis not present

## 2015-12-17 ENCOUNTER — Other Ambulatory Visit: Payer: Self-pay | Admitting: Gastroenterology

## 2015-12-17 ENCOUNTER — Ambulatory Visit
Admission: RE | Admit: 2015-12-17 | Discharge: 2015-12-17 | Disposition: A | Discharge status: Home / Self Care | Payer: Medicare Other | Source: Ambulatory Visit | Attending: Gastroenterology | Admitting: Gastroenterology

## 2015-12-17 DIAGNOSIS — R109 Unspecified abdominal pain: Secondary | ICD-10-CM | POA: Diagnosis not present

## 2015-12-17 DIAGNOSIS — K5900 Constipation, unspecified: Secondary | ICD-10-CM

## 2015-12-22 DIAGNOSIS — H43813 Vitreous degeneration, bilateral: Secondary | ICD-10-CM | POA: Diagnosis not present

## 2015-12-22 DIAGNOSIS — H04123 Dry eye syndrome of bilateral lacrimal glands: Secondary | ICD-10-CM | POA: Diagnosis not present

## 2015-12-22 DIAGNOSIS — H31093 Other chorioretinal scars, bilateral: Secondary | ICD-10-CM | POA: Diagnosis not present

## 2015-12-22 DIAGNOSIS — H2513 Age-related nuclear cataract, bilateral: Secondary | ICD-10-CM | POA: Diagnosis not present

## 2015-12-22 DIAGNOSIS — E119 Type 2 diabetes mellitus without complications: Secondary | ICD-10-CM | POA: Diagnosis not present

## 2015-12-28 DIAGNOSIS — S92111D Displaced fracture of neck of right talus, subsequent encounter for fracture with routine healing: Secondary | ICD-10-CM | POA: Diagnosis not present

## 2015-12-28 DIAGNOSIS — Z4789 Encounter for other orthopedic aftercare: Secondary | ICD-10-CM | POA: Diagnosis not present

## 2015-12-28 DIAGNOSIS — S8261XD Displaced fracture of lateral malleolus of right fibula, subsequent encounter for closed fracture with routine healing: Secondary | ICD-10-CM | POA: Diagnosis not present

## 2016-01-05 DIAGNOSIS — Z7984 Long term (current) use of oral hypoglycemic drugs: Secondary | ICD-10-CM | POA: Diagnosis not present

## 2016-01-05 DIAGNOSIS — E119 Type 2 diabetes mellitus without complications: Secondary | ICD-10-CM | POA: Diagnosis not present

## 2016-01-05 DIAGNOSIS — Z23 Encounter for immunization: Secondary | ICD-10-CM | POA: Diagnosis not present

## 2016-01-12 DIAGNOSIS — S92111D Displaced fracture of neck of right talus, subsequent encounter for fracture with routine healing: Secondary | ICD-10-CM | POA: Diagnosis not present

## 2016-01-12 DIAGNOSIS — S92111A Displaced fracture of neck of right talus, initial encounter for closed fracture: Secondary | ICD-10-CM | POA: Diagnosis not present

## 2016-01-14 DIAGNOSIS — S92111D Displaced fracture of neck of right talus, subsequent encounter for fracture with routine healing: Secondary | ICD-10-CM | POA: Diagnosis not present

## 2016-01-25 DIAGNOSIS — Z4789 Encounter for other orthopedic aftercare: Secondary | ICD-10-CM | POA: Diagnosis not present

## 2016-01-25 DIAGNOSIS — S92111D Displaced fracture of neck of right talus, subsequent encounter for fracture with routine healing: Secondary | ICD-10-CM | POA: Diagnosis not present

## 2016-01-25 DIAGNOSIS — S8261XD Displaced fracture of lateral malleolus of right fibula, subsequent encounter for closed fracture with routine healing: Secondary | ICD-10-CM | POA: Diagnosis not present

## 2016-01-26 DIAGNOSIS — S92111D Displaced fracture of neck of right talus, subsequent encounter for fracture with routine healing: Secondary | ICD-10-CM | POA: Diagnosis not present

## 2016-01-27 DIAGNOSIS — F419 Anxiety disorder, unspecified: Secondary | ICD-10-CM | POA: Diagnosis not present

## 2016-01-27 DIAGNOSIS — K5130 Ulcerative (chronic) rectosigmoiditis without complications: Secondary | ICD-10-CM | POA: Diagnosis not present

## 2016-01-28 DIAGNOSIS — S92111D Displaced fracture of neck of right talus, subsequent encounter for fracture with routine healing: Secondary | ICD-10-CM | POA: Diagnosis not present

## 2016-02-01 DIAGNOSIS — L409 Psoriasis, unspecified: Secondary | ICD-10-CM | POA: Diagnosis not present

## 2016-02-01 DIAGNOSIS — E559 Vitamin D deficiency, unspecified: Secondary | ICD-10-CM | POA: Diagnosis not present

## 2016-02-01 DIAGNOSIS — Z1159 Encounter for screening for other viral diseases: Secondary | ICD-10-CM | POA: Diagnosis not present

## 2016-02-01 DIAGNOSIS — F339 Major depressive disorder, recurrent, unspecified: Secondary | ICD-10-CM | POA: Diagnosis not present

## 2016-02-01 DIAGNOSIS — E785 Hyperlipidemia, unspecified: Secondary | ICD-10-CM | POA: Diagnosis not present

## 2016-02-01 DIAGNOSIS — I10 Essential (primary) hypertension: Secondary | ICD-10-CM | POA: Diagnosis not present

## 2016-02-05 DIAGNOSIS — K5130 Ulcerative (chronic) rectosigmoiditis without complications: Secondary | ICD-10-CM | POA: Diagnosis not present

## 2016-02-08 DIAGNOSIS — S92112D Displaced fracture of neck of left talus, subsequent encounter for fracture with routine healing: Secondary | ICD-10-CM | POA: Diagnosis not present

## 2016-02-08 DIAGNOSIS — S92111D Displaced fracture of neck of right talus, subsequent encounter for fracture with routine healing: Secondary | ICD-10-CM | POA: Diagnosis not present

## 2016-02-10 DIAGNOSIS — I10 Essential (primary) hypertension: Secondary | ICD-10-CM | POA: Diagnosis not present

## 2016-02-10 DIAGNOSIS — E119 Type 2 diabetes mellitus without complications: Secondary | ICD-10-CM | POA: Diagnosis not present

## 2016-02-10 DIAGNOSIS — Z1159 Encounter for screening for other viral diseases: Secondary | ICD-10-CM | POA: Diagnosis not present

## 2016-02-10 DIAGNOSIS — E559 Vitamin D deficiency, unspecified: Secondary | ICD-10-CM | POA: Diagnosis not present

## 2016-02-10 DIAGNOSIS — Z7984 Long term (current) use of oral hypoglycemic drugs: Secondary | ICD-10-CM | POA: Diagnosis not present

## 2016-02-11 DIAGNOSIS — S92111D Displaced fracture of neck of right talus, subsequent encounter for fracture with routine healing: Secondary | ICD-10-CM | POA: Diagnosis not present

## 2016-02-22 DIAGNOSIS — S92111D Displaced fracture of neck of right talus, subsequent encounter for fracture with routine healing: Secondary | ICD-10-CM | POA: Diagnosis not present

## 2016-02-22 DIAGNOSIS — S8261XD Displaced fracture of lateral malleolus of right fibula, subsequent encounter for closed fracture with routine healing: Secondary | ICD-10-CM | POA: Diagnosis not present

## 2016-02-23 DIAGNOSIS — S92111D Displaced fracture of neck of right talus, subsequent encounter for fracture with routine healing: Secondary | ICD-10-CM | POA: Diagnosis not present

## 2016-03-01 DIAGNOSIS — S92111D Displaced fracture of neck of right talus, subsequent encounter for fracture with routine healing: Secondary | ICD-10-CM | POA: Diagnosis not present

## 2016-03-03 DIAGNOSIS — S92111D Displaced fracture of neck of right talus, subsequent encounter for fracture with routine healing: Secondary | ICD-10-CM | POA: Diagnosis not present

## 2016-03-09 DIAGNOSIS — Z4789 Encounter for other orthopedic aftercare: Secondary | ICD-10-CM | POA: Diagnosis not present

## 2016-03-09 DIAGNOSIS — M722 Plantar fascial fibromatosis: Secondary | ICD-10-CM | POA: Diagnosis not present

## 2016-03-09 DIAGNOSIS — S92111D Displaced fracture of neck of right talus, subsequent encounter for fracture with routine healing: Secondary | ICD-10-CM | POA: Diagnosis not present

## 2016-03-15 DIAGNOSIS — S92111D Displaced fracture of neck of right talus, subsequent encounter for fracture with routine healing: Secondary | ICD-10-CM | POA: Diagnosis not present

## 2016-03-17 ENCOUNTER — Encounter: Payer: Self-pay | Admitting: *Deleted

## 2016-03-17 DIAGNOSIS — S92111D Displaced fracture of neck of right talus, subsequent encounter for fracture with routine healing: Secondary | ICD-10-CM | POA: Diagnosis not present

## 2016-03-21 DIAGNOSIS — Z7984 Long term (current) use of oral hypoglycemic drugs: Secondary | ICD-10-CM | POA: Diagnosis not present

## 2016-03-21 DIAGNOSIS — E119 Type 2 diabetes mellitus without complications: Secondary | ICD-10-CM | POA: Diagnosis not present

## 2016-03-29 DIAGNOSIS — S92111D Displaced fracture of neck of right talus, subsequent encounter for fracture with routine healing: Secondary | ICD-10-CM | POA: Diagnosis not present

## 2016-04-01 DIAGNOSIS — Z7984 Long term (current) use of oral hypoglycemic drugs: Secondary | ICD-10-CM | POA: Diagnosis not present

## 2016-04-01 DIAGNOSIS — E1165 Type 2 diabetes mellitus with hyperglycemia: Secondary | ICD-10-CM | POA: Diagnosis not present

## 2016-04-14 DIAGNOSIS — S92111D Displaced fracture of neck of right talus, subsequent encounter for fracture with routine healing: Secondary | ICD-10-CM | POA: Diagnosis not present

## 2016-04-20 DIAGNOSIS — S92111D Displaced fracture of neck of right talus, subsequent encounter for fracture with routine healing: Secondary | ICD-10-CM | POA: Diagnosis not present

## 2016-04-28 DIAGNOSIS — S92111A Displaced fracture of neck of right talus, initial encounter for closed fracture: Secondary | ICD-10-CM | POA: Diagnosis not present

## 2016-05-06 DIAGNOSIS — S92111D Displaced fracture of neck of right talus, subsequent encounter for fracture with routine healing: Secondary | ICD-10-CM | POA: Diagnosis not present

## 2016-05-06 DIAGNOSIS — Z7984 Long term (current) use of oral hypoglycemic drugs: Secondary | ICD-10-CM | POA: Diagnosis not present

## 2016-05-06 DIAGNOSIS — E1165 Type 2 diabetes mellitus with hyperglycemia: Secondary | ICD-10-CM | POA: Diagnosis not present

## 2016-05-18 DIAGNOSIS — L281 Prurigo nodularis: Secondary | ICD-10-CM | POA: Diagnosis not present

## 2016-05-18 DIAGNOSIS — L739 Follicular disorder, unspecified: Secondary | ICD-10-CM | POA: Diagnosis not present

## 2016-05-18 DIAGNOSIS — L309 Dermatitis, unspecified: Secondary | ICD-10-CM | POA: Diagnosis not present

## 2016-06-24 DIAGNOSIS — E559 Vitamin D deficiency, unspecified: Secondary | ICD-10-CM | POA: Diagnosis not present

## 2016-06-24 DIAGNOSIS — E1165 Type 2 diabetes mellitus with hyperglycemia: Secondary | ICD-10-CM | POA: Diagnosis not present

## 2016-06-24 DIAGNOSIS — Z7984 Long term (current) use of oral hypoglycemic drugs: Secondary | ICD-10-CM | POA: Diagnosis not present

## 2016-06-24 DIAGNOSIS — R5383 Other fatigue: Secondary | ICD-10-CM | POA: Diagnosis not present

## 2016-07-04 DIAGNOSIS — K5901 Slow transit constipation: Secondary | ICD-10-CM | POA: Diagnosis not present

## 2016-07-04 DIAGNOSIS — K3184 Gastroparesis: Secondary | ICD-10-CM | POA: Diagnosis not present

## 2016-07-04 DIAGNOSIS — K5130 Ulcerative (chronic) rectosigmoiditis without complications: Secondary | ICD-10-CM | POA: Diagnosis not present

## 2016-07-06 IMAGING — CR DG ANKLE COMPLETE 3+V*R*
3 series · 3 of 3 positions shown · non-contrast
Comparison: None.

CLINICAL DATA: Pt states she was trying to put stuff away in attic
today and fell. Right ankle pain/swelling to medial and lateral
right ankle.

EXAM:
RIGHT ANKLE - COMPLETE 3+ VIEW

[x ankle ap right]
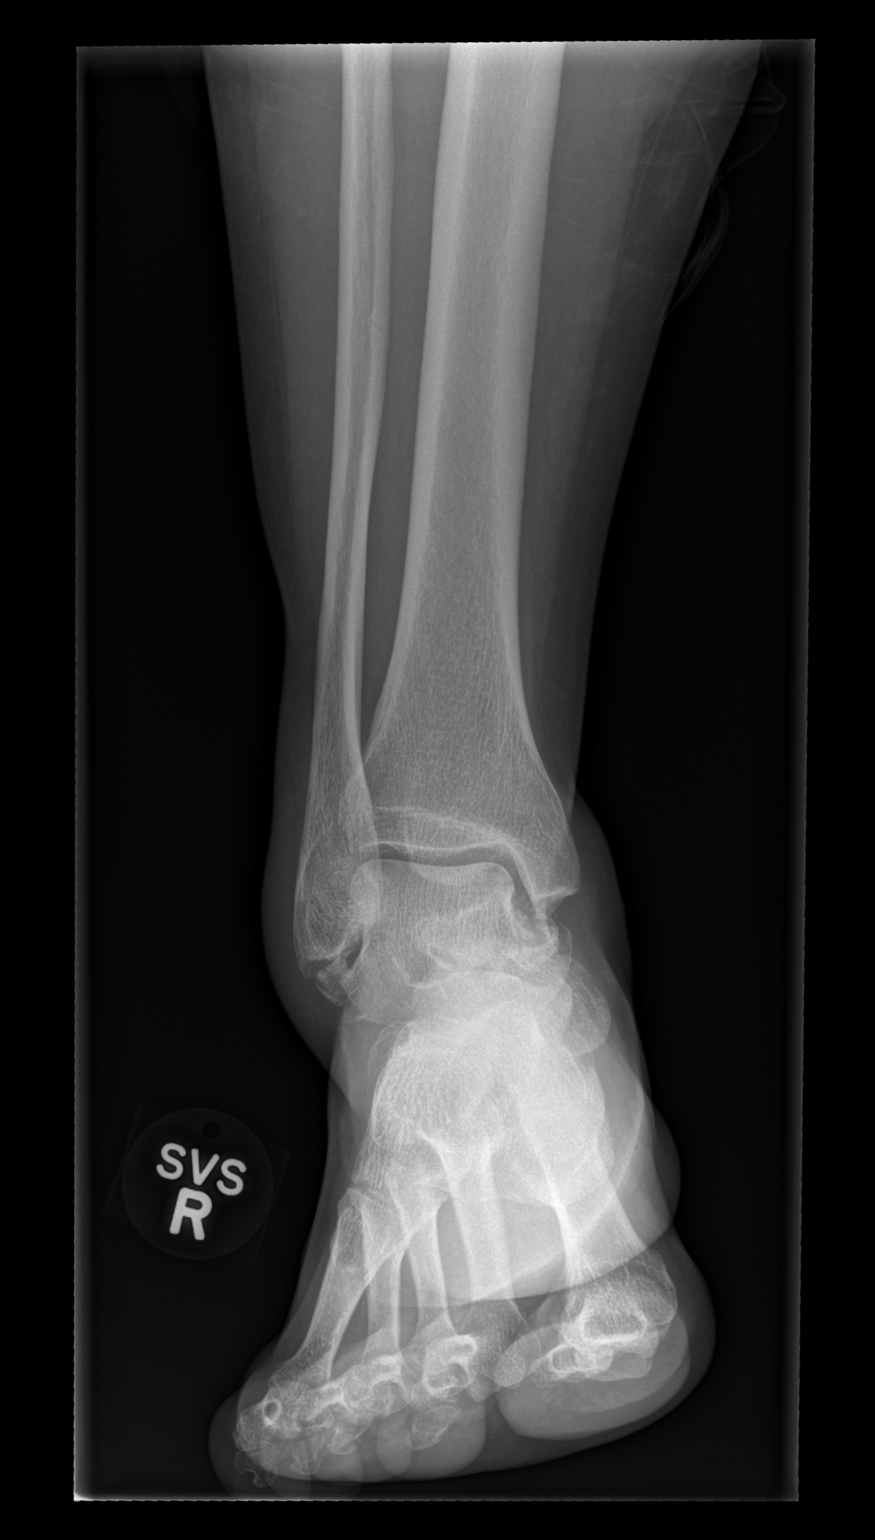

[x ankle obl right]
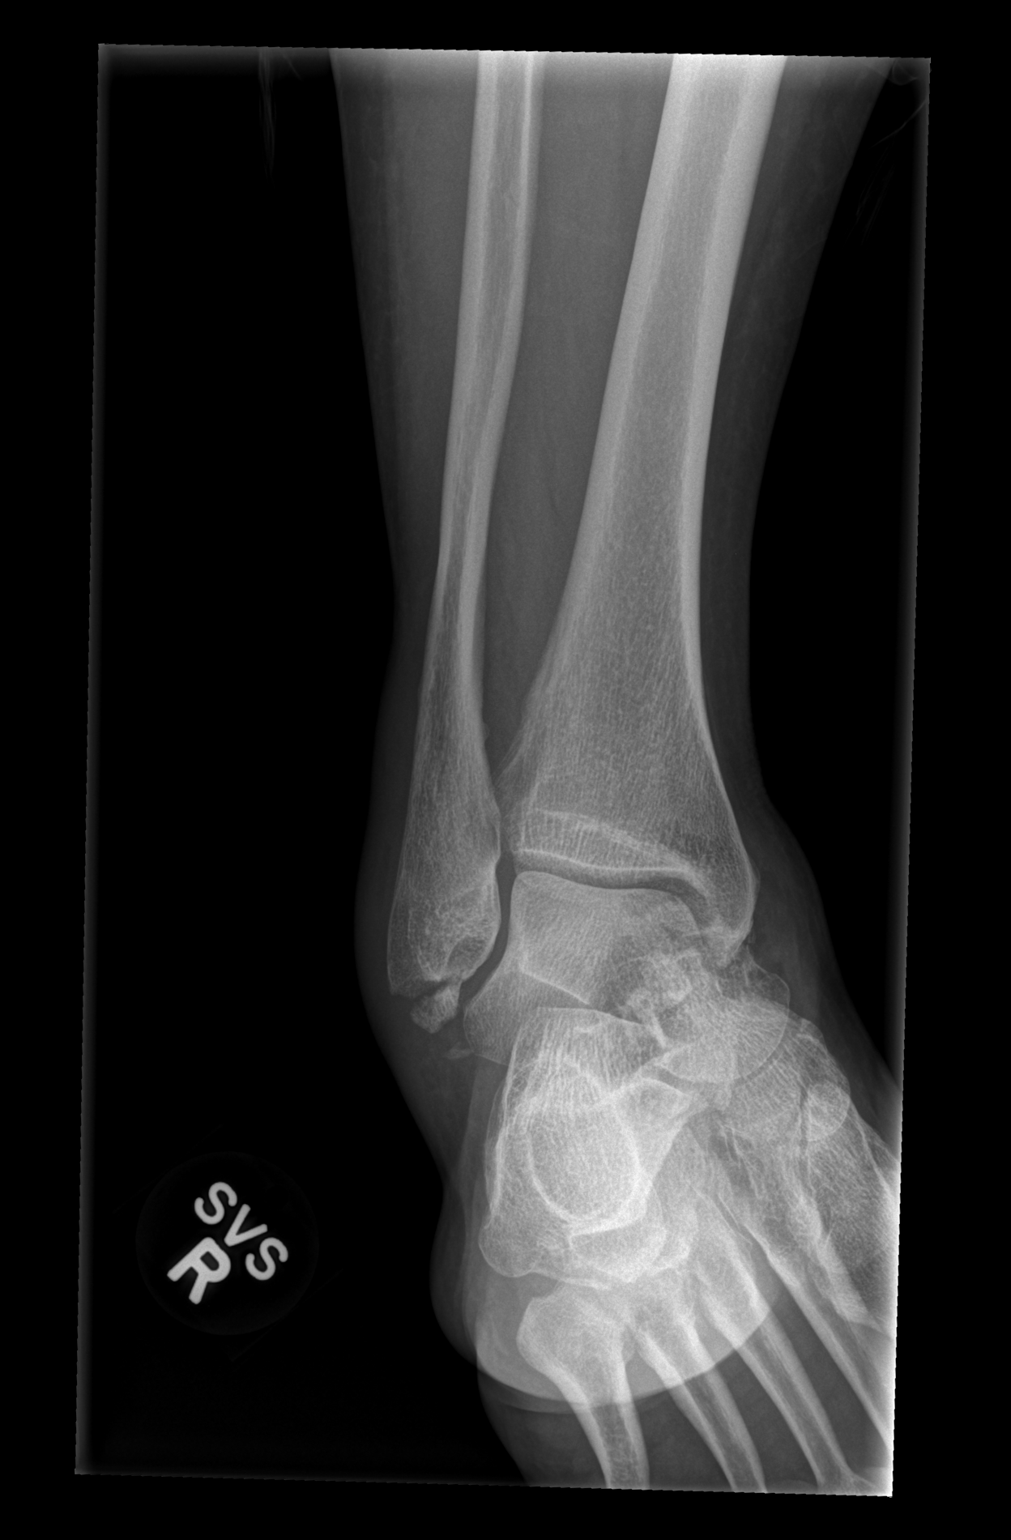

[x ankle lat right]
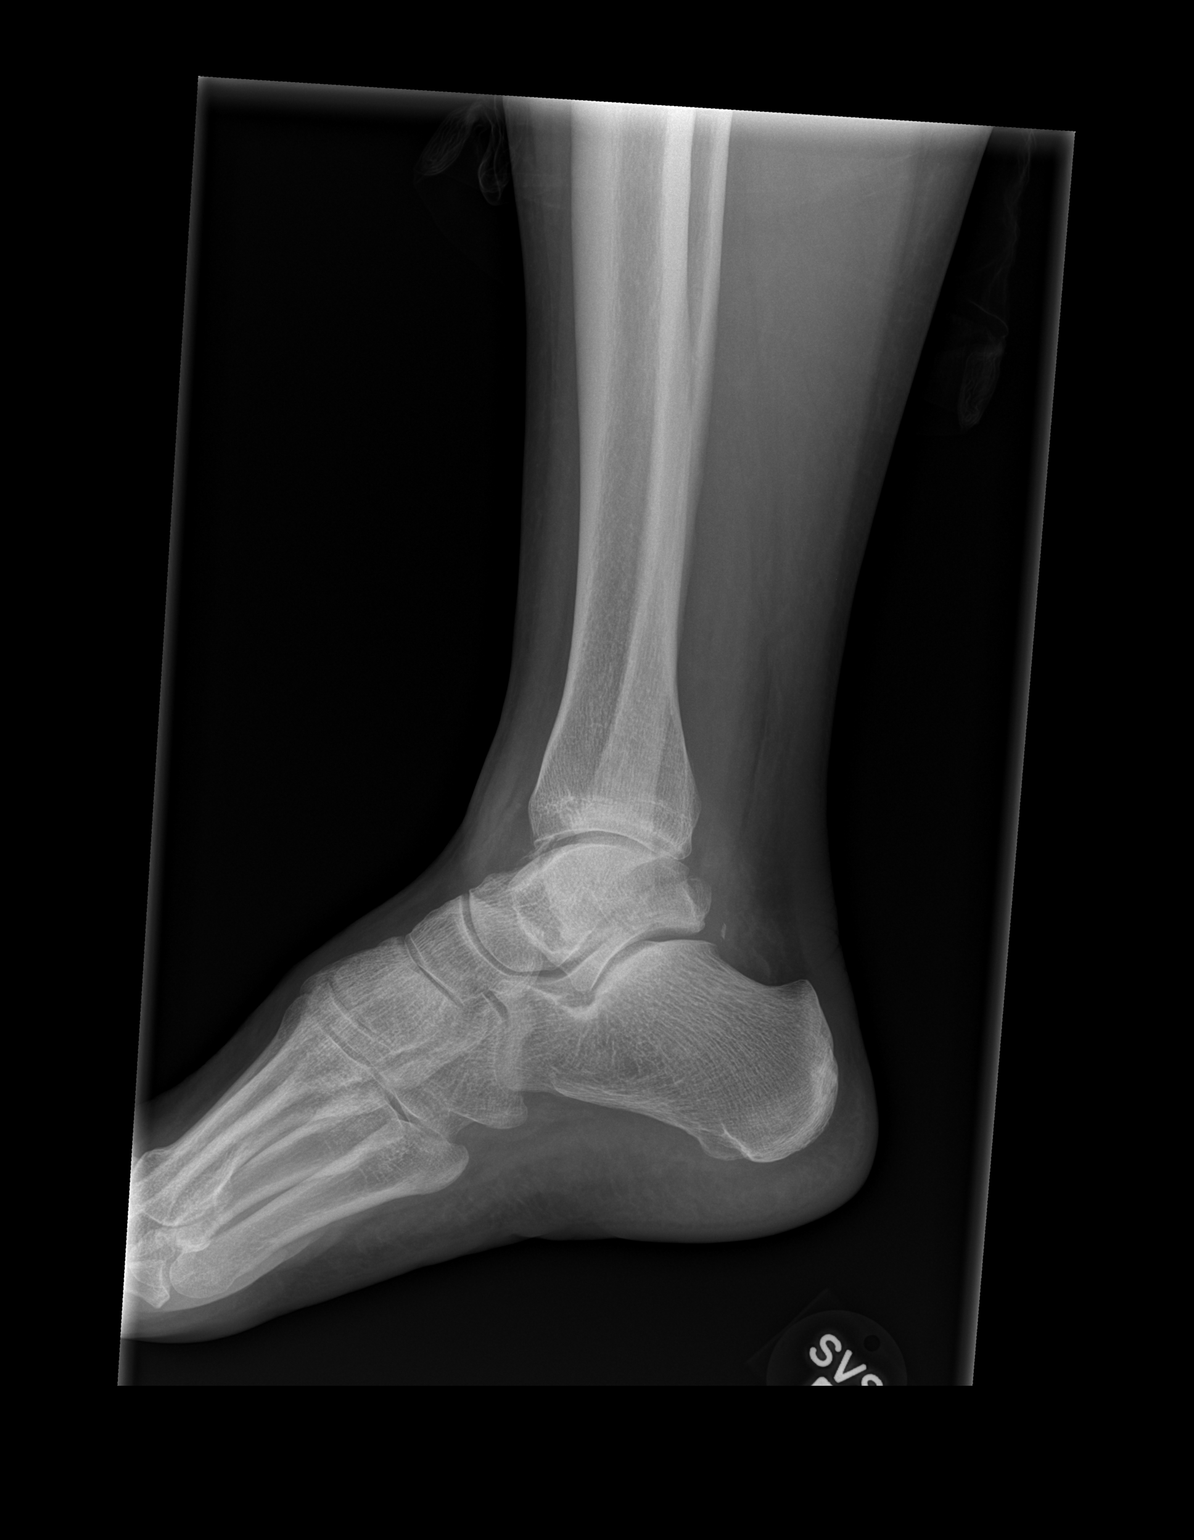

[3 of 3 positions shown; findings below may reference images not displayed]

FINDINGS: Transverse fracture of the lateral malleolus, distracted up to 5 mm,
with overlying soft tissue swelling. Ankle mortise is intact. Medial
and posterior malleoli appear intact. Otherwise normal
mineralization and alignment. No significant osseous degenerative
change.
IMPRESSION: 1. Minimally distracted lateral malleolar fracture.

## 2016-07-13 ENCOUNTER — Ambulatory Visit
Admission: RE | Admit: 2016-07-13 | Discharge: 2016-07-13 | Disposition: A | Discharge status: Home / Self Care | Payer: Medicare Other | Source: Ambulatory Visit | Attending: Internal Medicine | Admitting: Internal Medicine

## 2016-07-13 ENCOUNTER — Other Ambulatory Visit: Payer: Self-pay | Admitting: Internal Medicine

## 2016-07-13 DIAGNOSIS — R918 Other nonspecific abnormal finding of lung field: Secondary | ICD-10-CM | POA: Diagnosis not present

## 2016-08-01 DIAGNOSIS — E785 Hyperlipidemia, unspecified: Secondary | ICD-10-CM | POA: Diagnosis not present

## 2016-08-01 DIAGNOSIS — Z111 Encounter for screening for respiratory tuberculosis: Secondary | ICD-10-CM | POA: Diagnosis not present

## 2016-08-01 DIAGNOSIS — I10 Essential (primary) hypertension: Secondary | ICD-10-CM | POA: Diagnosis not present

## 2016-08-01 DIAGNOSIS — Z8611 Personal history of tuberculosis: Secondary | ICD-10-CM | POA: Diagnosis not present

## 2016-08-01 DIAGNOSIS — Z23 Encounter for immunization: Secondary | ICD-10-CM | POA: Diagnosis not present

## 2016-08-01 DIAGNOSIS — R938 Abnormal findings on diagnostic imaging of other specified body structures: Secondary | ICD-10-CM | POA: Diagnosis not present

## 2016-08-01 DIAGNOSIS — F3341 Major depressive disorder, recurrent, in partial remission: Secondary | ICD-10-CM | POA: Diagnosis not present

## 2016-09-14 DIAGNOSIS — M545 Low back pain: Secondary | ICD-10-CM | POA: Diagnosis not present

## 2016-09-14 DIAGNOSIS — M4126 Other idiopathic scoliosis, lumbar region: Secondary | ICD-10-CM | POA: Diagnosis not present

## 2016-09-30 DIAGNOSIS — E1165 Type 2 diabetes mellitus with hyperglycemia: Secondary | ICD-10-CM | POA: Diagnosis not present

## 2016-09-30 DIAGNOSIS — Z7984 Long term (current) use of oral hypoglycemic drugs: Secondary | ICD-10-CM | POA: Diagnosis not present

## 2016-09-30 DIAGNOSIS — Z8611 Personal history of tuberculosis: Secondary | ICD-10-CM | POA: Diagnosis not present

## 2016-10-03 DIAGNOSIS — K5130 Ulcerative (chronic) rectosigmoiditis without complications: Secondary | ICD-10-CM | POA: Diagnosis not present

## 2016-10-21 DIAGNOSIS — S60372A Other superficial bite of left thumb, initial encounter: Secondary | ICD-10-CM | POA: Diagnosis not present

## 2016-10-21 DIAGNOSIS — W540XXA Bitten by dog, initial encounter: Secondary | ICD-10-CM | POA: Diagnosis not present

## 2016-11-09 DIAGNOSIS — M1812 Unilateral primary osteoarthritis of first carpometacarpal joint, left hand: Secondary | ICD-10-CM | POA: Diagnosis not present

## 2016-11-09 DIAGNOSIS — M1811 Unilateral primary osteoarthritis of first carpometacarpal joint, right hand: Secondary | ICD-10-CM | POA: Diagnosis not present

## 2016-11-09 DIAGNOSIS — M180 Bilateral primary osteoarthritis of first carpometacarpal joints: Secondary | ICD-10-CM | POA: Diagnosis not present

## 2016-11-09 DIAGNOSIS — G5601 Carpal tunnel syndrome, right upper limb: Secondary | ICD-10-CM | POA: Diagnosis not present

## 2017-03-23 IMAGING — CR DG CHEST 2V
2 series · 2 of 2 positions shown · non-contrast
Comparison: Chest x-ray of 09/09/2014

CLINICAL DATA: Hypercalcemia, history of right thoracotomy

EXAM:
CHEST  2 VIEW

[w chest pa]
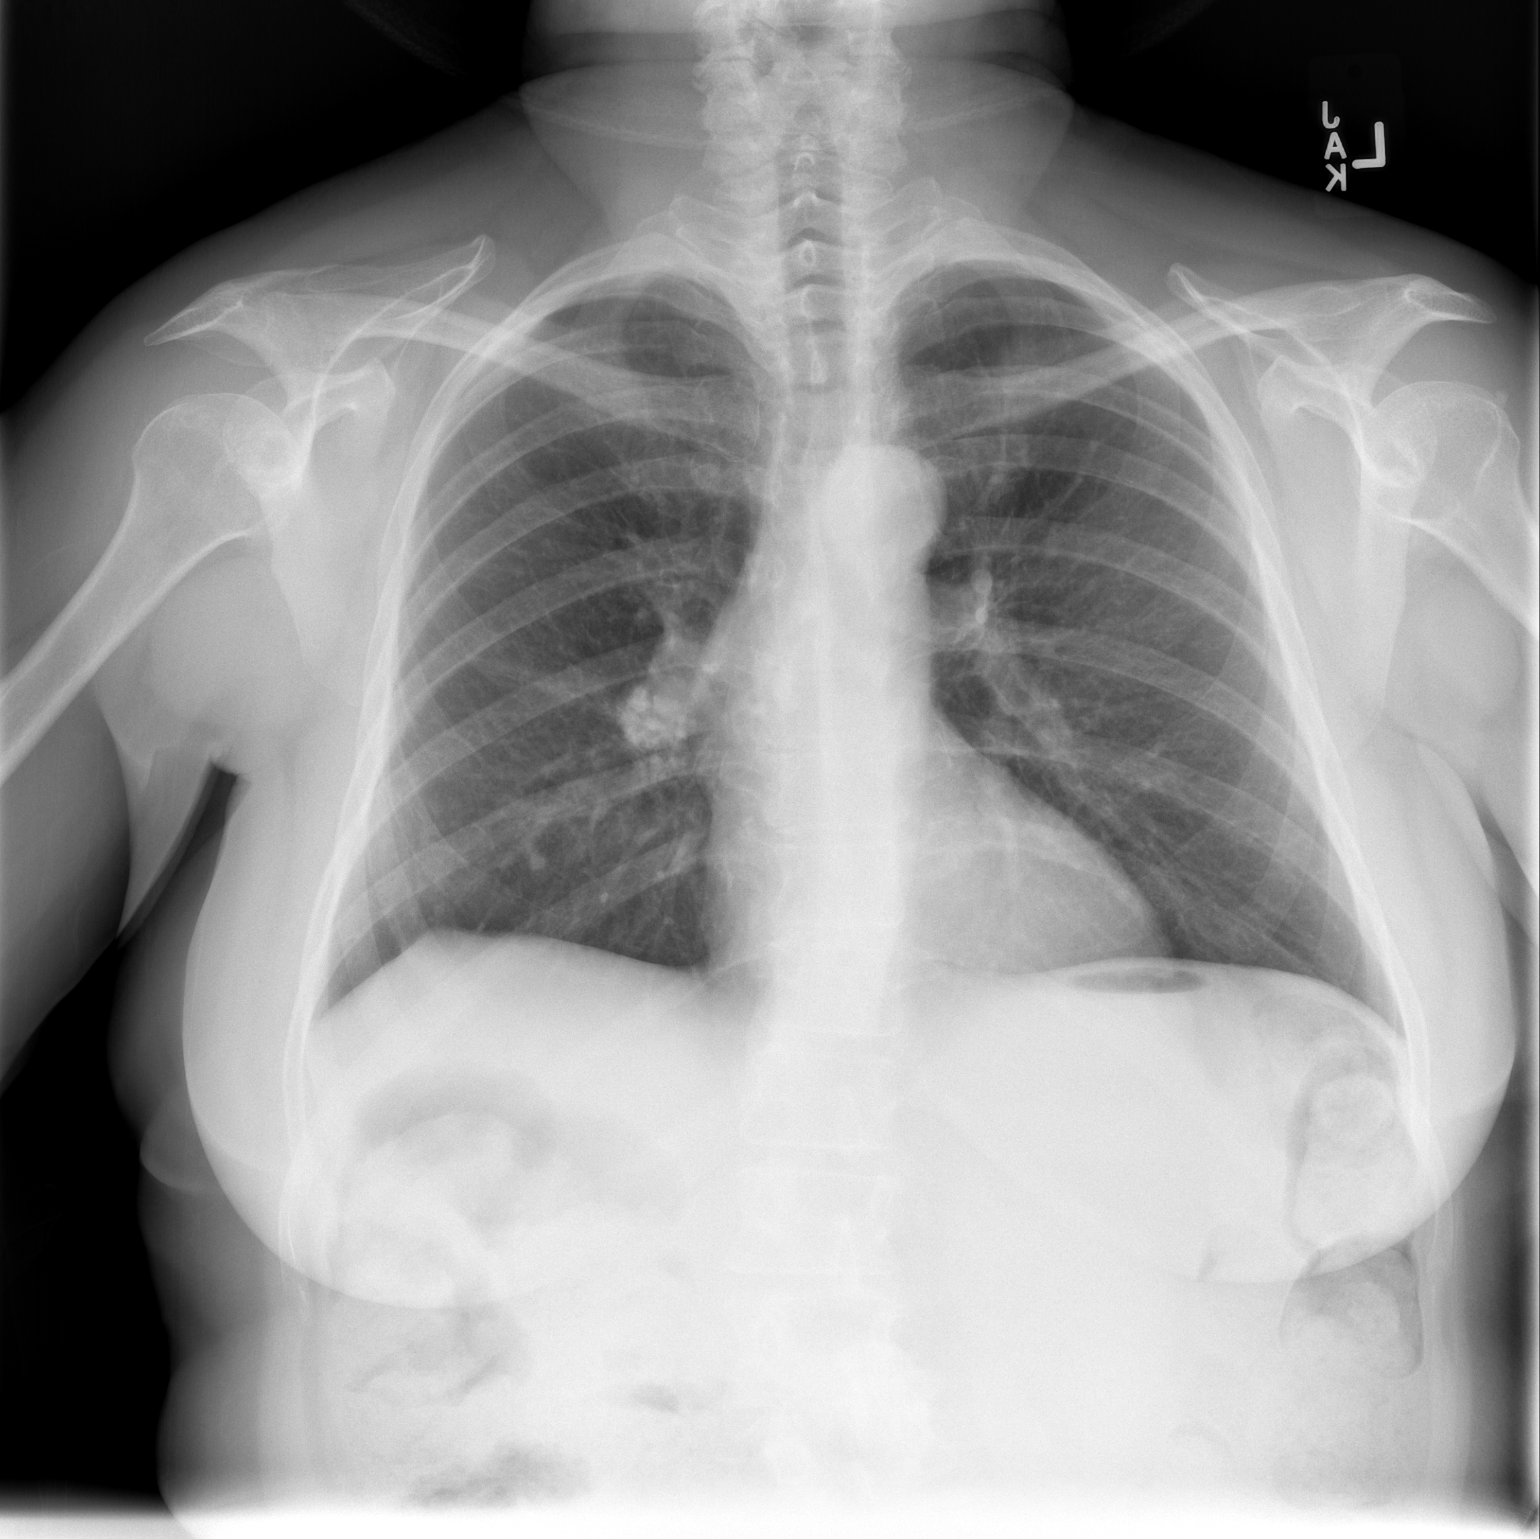

[w chest lat]
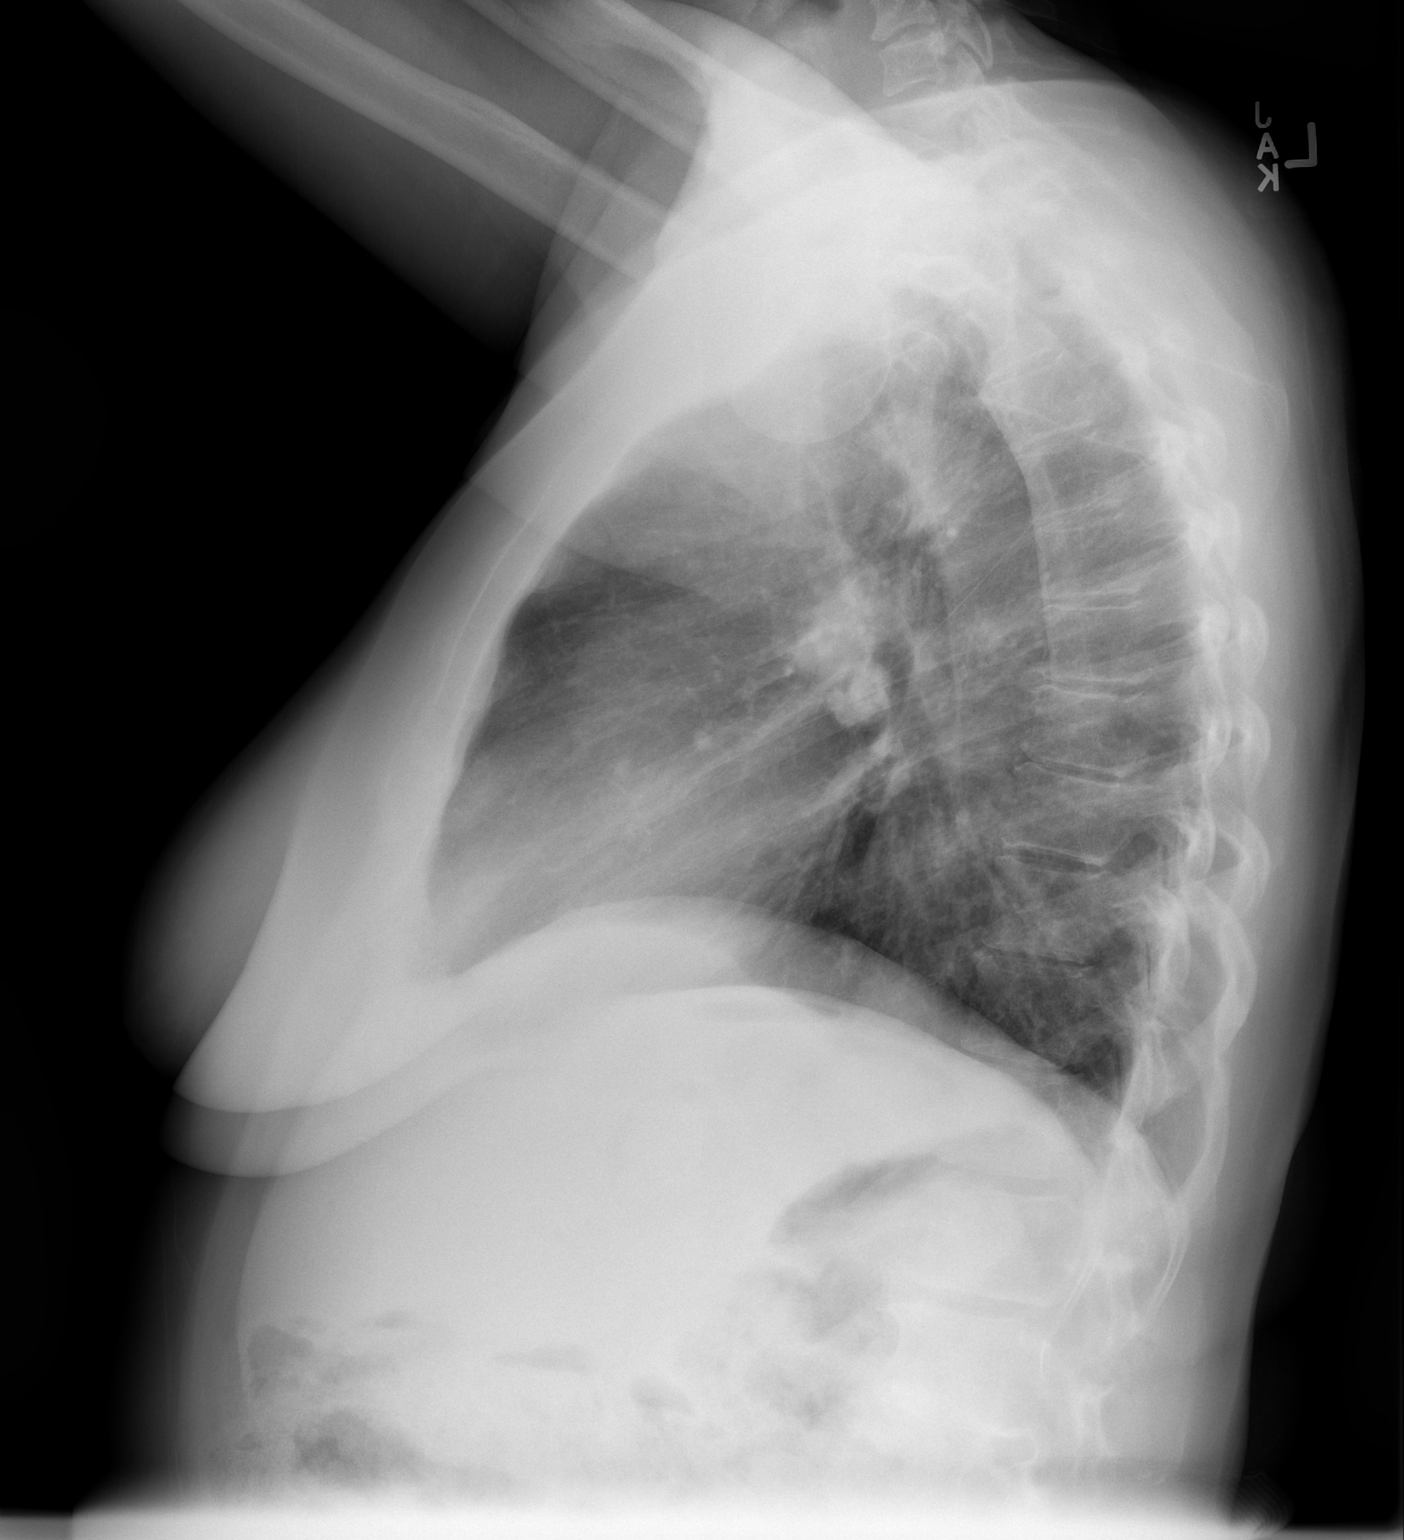

[2 of 2 positions shown; findings below may reference images not displayed]

FINDINGS: No active infiltrate or effusion is seen. Mediastinal and hilar
contours are unremarkable. Calcified right hilar nodes again are
present from prior granulomatous disease. The heart is within normal
limits in size. No bony abnormality is seen.
IMPRESSION: No active cardiopulmonary disease.

## 2017-12-22 ENCOUNTER — Ambulatory Visit: Payer: Medicare Other | Admitting: *Deleted

## 2018-01-03 ENCOUNTER — Ambulatory Visit: Payer: Medicare Other | Admitting: *Deleted

## 2018-01-17 ENCOUNTER — Ambulatory Visit: Payer: Medicare Other | Admitting: *Deleted

## 2018-01-30 ENCOUNTER — Encounter: Payer: Medicare Other | Attending: Internal Medicine | Admitting: *Deleted

## 2018-01-30 DIAGNOSIS — Z794 Long term (current) use of insulin: Secondary | ICD-10-CM | POA: Diagnosis not present

## 2018-01-30 DIAGNOSIS — E1165 Type 2 diabetes mellitus with hyperglycemia: Secondary | ICD-10-CM | POA: Diagnosis not present

## 2018-01-30 DIAGNOSIS — Z713 Dietary counseling and surveillance: Secondary | ICD-10-CM | POA: Diagnosis not present

## 2018-01-30 DIAGNOSIS — E119 Type 2 diabetes mellitus without complications: Secondary | ICD-10-CM

## 2018-02-01 NOTE — Patient Instructions (Signed)
Plan:  Aim for 2-3 Carb Choices per meal (30-45 grams)  Aim for 0-2 Carbs per snack if hungry  Include protein in moderation with your meals and snacks Consider reading food labels for Total Carbohydrate of foods Continue with your activity level by Arm chair Exercises for 15-30 minutes 3 days a week as tolerated Consider checking BG at alternate times per day  Consider taking medication as directed by MD

## 2018-02-01 NOTE — Progress Notes (Signed)
Diabetes Self-Management Education  Visit Type: First/Initial  Appt. Start Time: 1530 Appt. End Time: 1700  02/01/2018  [name], identified by name and date of birth, is a 67 y.o. female with a diagnosis of Diabetes: Type 2. She appears anxious and talkative today. She explained that her husband has had a stroke and cannot be left alone, she is his primary care giver. She also expressed concern over her multiple medical problems and how hard it is to try and take care of all of them at the same time. Her activity is limited do to back pain, arthritis pain and erratic sleep schedule. She states she is testing her BG 2-3 times a day and worries a lot about having diabetes.   ASSESSMENT  There were no vitals taken for this visit. There is no height or weight on file to calculate BMI.  Diabetes Self-Management Education - 01/30/18 1538      Visit Information   Visit Type  First/Initial      Initial Visit   Diabetes Type  Type 2    Are you currently following a meal plan?  No    Are you taking your medications as prescribed?  Yes    Date Diagnosed  2006      Health Coping   How would you rate your overall health?  Fair      Psychosocial Assessment   Patient Belief/Attitude about Diabetes  Defeat/Burnout Afraid and exhausted    Self-care barriers  Other (comment) care giver to disabled husband    Self-management support  Family    Other persons present  Patient    Patient Concerns  Nutrition/Meal planning;Glycemic Control;Weight Control    Special Needs  None    Preferred Learning Style  No preference indicated    Learning Readiness  Change in progress    How often do you need to have someone help you when you read instructions, pamphlets, or other written materials from your doctor or pharmacy?  1 - Never    What is the last grade level you completed in school?  Masters in [employer] Assessment   Patient understands the diabetes disease and  treatment process.  Needs Instruction    Patient understands incorporating nutritional management into lifestyle.  Needs Instruction    Patient undertands incorporating physical activity into lifestyle.  Needs Instruction    Patient understands using medications safely.  Needs Instruction    Patient understands monitoring blood glucose, interpreting and using results  Needs Instruction    Patient understands prevention, detection, and treatment of acute complications.  Needs Instruction    Patient understands prevention, detection, and treatment of chronic complications.  Needs Instruction    Patient understands how to develop strategies to address psychosocial issues.  Needs Instruction    Patient understands how to develop strategies to promote health/change behavior.  Needs Instruction      Complications   Last HgB A1C per patient/outside source  7.4 %    How often do you check your blood sugar?  1-2 times/day    Have you had a dilated eye exam in the past 12 months?  Yes    Have you had a dental exam in the past 12 months?  Yes    Are you checking your feet?  No      Dietary Intake   Breakfast  grits with cheese OR PNB sandwich with sugar free bread OR PNB with 1/2 banana  Lunch  skip often due to irratic sleep habits, may have light bread with protein such as cheese, avacado    Dinner  may combine with mid day meal OR forgets to eat, may prepare meal for husband; vegetarian meat with vegetables, beans, eggs, sweet potato, applesauce    Snack (evening)  ultra thin chips with black beans and melted cheese OR guacamole OR vegetables    Beverage(s)  water, [employer] milk,       Exercise   Exercise Type  ADL's    How many days per week to you exercise?  3    How many minutes per day do you exercise?  45    Total minutes per week of exercise  135      Patient Education   Previous Diabetes Education  No    Disease state   Definition of diabetes, type 1 and 2, and the diagnosis of  diabetes    Nutrition management   Role of diet in the treatment of diabetes and the relationship between the three main macronutrients and blood glucose level;Carbohydrate counting;Meal timing in regards to the patients' current diabetes medication.    Physical activity and exercise   Role of exercise on diabetes management, blood pressure control and cardiac health.    Medications  Reviewed patients medication for diabetes, action, purpose, timing of dose and side effects.    Monitoring  Identified appropriate SMBG and/or A1C goals.    Acute complications  Taught treatment of hypoglycemia - the 15 rule.    Chronic complications  Relationship between chronic complications and blood glucose control    Psychosocial adjustment  Worked with patient to identify barriers to care and solutions;Identified and addressed patients feelings and concerns about diabetes;Role of stress on diabetes      Individualized Goals (developed by patient)   Nutrition  Follow meal plan discussed    Physical Activity  Exercise 3-5 times per week    Medications  take my medication as prescribed    Monitoring   test blood glucose pre and post meals as discussed      Post-Education Assessment   Patient understands the diabetes disease and treatment process.  Demonstrates understanding / competency    Patient understands incorporating nutritional management into lifestyle.  Demonstrates understanding / competency    Patient undertands incorporating physical activity into lifestyle.  Needs Review    Patient understands using medications safely.  Demonstrates understanding / competency    Patient understands monitoring blood glucose, interpreting and using results  Demonstrates understanding / competency    Patient understands prevention, detection, and treatment of acute complications.  Demonstrates understanding / competency    Patient understands prevention, detection, and treatment of chronic complications.  Demonstrates  understanding / competency    Patient understands how to develop strategies to address psychosocial issues.  Needs Review    Patient understands how to develop strategies to promote health/change behavior.  Needs Review      Outcomes   Expected Outcomes  Demonstrated interest in learning. Expect positive outcomes    Future DMSE  4-6 wks    Program Status  Completed       Individualized Plan for Diabetes Self-Management Training:   Learning Objective:  Patient will have a greater understanding of diabetes self-management. Patient education plan is to attend individual and/or group sessions per assessed needs and concerns.   Plan:   Patient Instructions  Plan:  Aim for 2-3 Carb Choices per meal (30-45 grams)  Aim for 0-2  Carbs per snack if hungry  Include protein in moderation with your meals and snacks Consider reading food labels for Total Carbohydrate of foods Continue with your activity level by Arm chair Exercises for 15-30 minutes 3 days a week as tolerated Consider checking BG at alternate times per day  Continue taking medication as directed by MD  Expected Outcomes:  Demonstrated interest in learning. Expect positive outcomes  Education material provided: Meal plan card and Carbohydrate counting sheet, Arm chair exercise handout  If problems or questions, patient to contact team via:  Phone  Future DSME appointment: 4-6 wks

## 2018-03-13 ENCOUNTER — Ambulatory Visit: Payer: Medicare Other | Admitting: *Deleted

## 2020-05-11 DIAGNOSIS — E11649 Type 2 diabetes mellitus with hypoglycemia without coma: Secondary | ICD-10-CM | POA: Diagnosis not present

## 2020-05-11 DIAGNOSIS — E785 Hyperlipidemia, unspecified: Secondary | ICD-10-CM | POA: Diagnosis not present

## 2020-05-11 DIAGNOSIS — F331 Major depressive disorder, recurrent, moderate: Secondary | ICD-10-CM | POA: Diagnosis not present

## 2020-05-11 DIAGNOSIS — J4520 Mild intermittent asthma, uncomplicated: Secondary | ICD-10-CM | POA: Diagnosis not present

## 2020-05-11 DIAGNOSIS — E1169 Type 2 diabetes mellitus with other specified complication: Secondary | ICD-10-CM | POA: Diagnosis not present

## 2020-05-11 DIAGNOSIS — F339 Major depressive disorder, recurrent, unspecified: Secondary | ICD-10-CM | POA: Diagnosis not present

## 2020-05-11 DIAGNOSIS — E1165 Type 2 diabetes mellitus with hyperglycemia: Secondary | ICD-10-CM | POA: Diagnosis not present

## 2020-05-11 DIAGNOSIS — I10 Essential (primary) hypertension: Secondary | ICD-10-CM | POA: Diagnosis not present

## 2020-05-11 DIAGNOSIS — F3341 Major depressive disorder, recurrent, in partial remission: Secondary | ICD-10-CM | POA: Diagnosis not present

## 2020-05-26 DIAGNOSIS — I10 Essential (primary) hypertension: Secondary | ICD-10-CM | POA: Diagnosis not present

## 2020-06-12 DIAGNOSIS — I10 Essential (primary) hypertension: Secondary | ICD-10-CM | POA: Diagnosis not present

## 2020-06-12 DIAGNOSIS — Z87891 Personal history of nicotine dependence: Secondary | ICD-10-CM | POA: Diagnosis not present

## 2020-06-12 DIAGNOSIS — E785 Hyperlipidemia, unspecified: Secondary | ICD-10-CM | POA: Diagnosis not present

## 2020-06-12 DIAGNOSIS — Z79899 Other long term (current) drug therapy: Secondary | ICD-10-CM | POA: Diagnosis not present

## 2020-06-23 DIAGNOSIS — Z794 Long term (current) use of insulin: Secondary | ICD-10-CM | POA: Diagnosis not present

## 2020-06-23 DIAGNOSIS — E119 Type 2 diabetes mellitus without complications: Secondary | ICD-10-CM | POA: Diagnosis not present

## 2020-06-24 DIAGNOSIS — M65331 Trigger finger, right middle finger: Secondary | ICD-10-CM | POA: Diagnosis not present

## 2020-06-24 DIAGNOSIS — R2230 Localized swelling, mass and lump, unspecified upper limb: Secondary | ICD-10-CM | POA: Diagnosis not present

## 2020-06-24 DIAGNOSIS — M13841 Other specified arthritis, right hand: Secondary | ICD-10-CM | POA: Diagnosis not present

## 2020-07-16 DIAGNOSIS — E1165 Type 2 diabetes mellitus with hyperglycemia: Secondary | ICD-10-CM | POA: Diagnosis not present

## 2020-10-09 DIAGNOSIS — E1165 Type 2 diabetes mellitus with hyperglycemia: Secondary | ICD-10-CM | POA: Diagnosis not present

## 2020-11-10 DIAGNOSIS — Z23 Encounter for immunization: Secondary | ICD-10-CM | POA: Diagnosis not present

## 2020-12-10 DIAGNOSIS — E1169 Type 2 diabetes mellitus with other specified complication: Secondary | ICD-10-CM | POA: Diagnosis not present

## 2020-12-10 DIAGNOSIS — L409 Psoriasis, unspecified: Secondary | ICD-10-CM | POA: Diagnosis not present

## 2020-12-10 DIAGNOSIS — E559 Vitamin D deficiency, unspecified: Secondary | ICD-10-CM | POA: Diagnosis not present

## 2020-12-10 DIAGNOSIS — R059 Cough, unspecified: Secondary | ICD-10-CM | POA: Diagnosis not present

## 2020-12-10 DIAGNOSIS — K3184 Gastroparesis: Secondary | ICD-10-CM | POA: Diagnosis not present

## 2020-12-10 DIAGNOSIS — Z79899 Other long term (current) drug therapy: Secondary | ICD-10-CM | POA: Diagnosis not present

## 2020-12-10 DIAGNOSIS — I10 Essential (primary) hypertension: Secondary | ICD-10-CM | POA: Diagnosis not present

## 2020-12-10 DIAGNOSIS — E785 Hyperlipidemia, unspecified: Secondary | ICD-10-CM | POA: Diagnosis not present

## 2020-12-10 DIAGNOSIS — F339 Major depressive disorder, recurrent, unspecified: Secondary | ICD-10-CM | POA: Diagnosis not present

## 2020-12-31 DIAGNOSIS — E785 Hyperlipidemia, unspecified: Secondary | ICD-10-CM | POA: Diagnosis not present

## 2020-12-31 DIAGNOSIS — E1165 Type 2 diabetes mellitus with hyperglycemia: Secondary | ICD-10-CM | POA: Diagnosis not present

## 2020-12-31 DIAGNOSIS — F3341 Major depressive disorder, recurrent, in partial remission: Secondary | ICD-10-CM | POA: Diagnosis not present

## 2020-12-31 DIAGNOSIS — E1169 Type 2 diabetes mellitus with other specified complication: Secondary | ICD-10-CM | POA: Diagnosis not present

## 2020-12-31 DIAGNOSIS — F331 Major depressive disorder, recurrent, moderate: Secondary | ICD-10-CM | POA: Diagnosis not present

## 2020-12-31 DIAGNOSIS — E11649 Type 2 diabetes mellitus with hypoglycemia without coma: Secondary | ICD-10-CM | POA: Diagnosis not present

## 2020-12-31 DIAGNOSIS — F339 Major depressive disorder, recurrent, unspecified: Secondary | ICD-10-CM | POA: Diagnosis not present

## 2020-12-31 DIAGNOSIS — I10 Essential (primary) hypertension: Secondary | ICD-10-CM | POA: Diagnosis not present

## 2020-12-31 DIAGNOSIS — J4520 Mild intermittent asthma, uncomplicated: Secondary | ICD-10-CM | POA: Diagnosis not present

## 2021-01-07 DIAGNOSIS — E1165 Type 2 diabetes mellitus with hyperglycemia: Secondary | ICD-10-CM | POA: Diagnosis not present

## 2021-02-04 DIAGNOSIS — E785 Hyperlipidemia, unspecified: Secondary | ICD-10-CM | POA: Diagnosis not present

## 2021-02-04 DIAGNOSIS — E559 Vitamin D deficiency, unspecified: Secondary | ICD-10-CM | POA: Diagnosis not present

## 2021-02-04 DIAGNOSIS — Z79899 Other long term (current) drug therapy: Secondary | ICD-10-CM | POA: Diagnosis not present

## 2021-02-04 DIAGNOSIS — E119 Type 2 diabetes mellitus without complications: Secondary | ICD-10-CM | POA: Diagnosis not present

## 2021-02-04 DIAGNOSIS — K3184 Gastroparesis: Secondary | ICD-10-CM | POA: Diagnosis not present

## 2021-02-04 DIAGNOSIS — I10 Essential (primary) hypertension: Secondary | ICD-10-CM | POA: Diagnosis not present

## 2021-03-29 DIAGNOSIS — E1165 Type 2 diabetes mellitus with hyperglycemia: Secondary | ICD-10-CM | POA: Diagnosis not present

## 2021-04-06 DIAGNOSIS — M79642 Pain in left hand: Secondary | ICD-10-CM | POA: Diagnosis not present

## 2021-04-06 DIAGNOSIS — R059 Cough, unspecified: Secondary | ICD-10-CM | POA: Diagnosis not present

## 2021-04-06 DIAGNOSIS — L989 Disorder of the skin and subcutaneous tissue, unspecified: Secondary | ICD-10-CM | POA: Diagnosis not present

## 2021-04-06 DIAGNOSIS — I10 Essential (primary) hypertension: Secondary | ICD-10-CM | POA: Diagnosis not present

## 2021-04-06 DIAGNOSIS — M79641 Pain in right hand: Secondary | ICD-10-CM | POA: Diagnosis not present

## 2021-04-08 ENCOUNTER — Ambulatory Visit
Admission: RE | Admit: 2021-04-08 | Discharge: 2021-04-08 | Disposition: A | Discharge status: Home / Self Care | Payer: Medicare Other | Source: Ambulatory Visit | Attending: Family Medicine | Admitting: Family Medicine

## 2021-04-08 ENCOUNTER — Other Ambulatory Visit: Payer: Self-pay | Admitting: Family Medicine

## 2021-04-08 DIAGNOSIS — R059 Cough, unspecified: Secondary | ICD-10-CM

## 2021-04-08 DIAGNOSIS — Z794 Long term (current) use of insulin: Secondary | ICD-10-CM | POA: Diagnosis not present

## 2021-04-08 DIAGNOSIS — E1165 Type 2 diabetes mellitus with hyperglycemia: Secondary | ICD-10-CM | POA: Diagnosis not present

## 2021-05-14 DIAGNOSIS — Z79899 Other long term (current) drug therapy: Secondary | ICD-10-CM | POA: Diagnosis not present

## 2021-05-14 DIAGNOSIS — E559 Vitamin D deficiency, unspecified: Secondary | ICD-10-CM | POA: Diagnosis not present

## 2021-05-14 DIAGNOSIS — E785 Hyperlipidemia, unspecified: Secondary | ICD-10-CM | POA: Diagnosis not present

## 2021-05-19 DIAGNOSIS — N952 Postmenopausal atrophic vaginitis: Secondary | ICD-10-CM | POA: Diagnosis not present

## 2021-05-19 DIAGNOSIS — M79642 Pain in left hand: Secondary | ICD-10-CM | POA: Diagnosis not present

## 2021-05-19 DIAGNOSIS — R059 Cough, unspecified: Secondary | ICD-10-CM | POA: Diagnosis not present

## 2021-05-19 DIAGNOSIS — L409 Psoriasis, unspecified: Secondary | ICD-10-CM | POA: Diagnosis not present

## 2021-05-19 DIAGNOSIS — M79641 Pain in right hand: Secondary | ICD-10-CM | POA: Diagnosis not present

## 2021-05-19 DIAGNOSIS — I10 Essential (primary) hypertension: Secondary | ICD-10-CM | POA: Diagnosis not present

## 2021-06-15 DIAGNOSIS — M5136 Other intervertebral disc degeneration, lumbar region: Secondary | ICD-10-CM | POA: Diagnosis not present

## 2021-06-17 DIAGNOSIS — E1165 Type 2 diabetes mellitus with hyperglycemia: Secondary | ICD-10-CM | POA: Diagnosis not present

## 2021-06-25 DIAGNOSIS — J029 Acute pharyngitis, unspecified: Secondary | ICD-10-CM | POA: Diagnosis not present

## 2021-06-25 DIAGNOSIS — R5383 Other fatigue: Secondary | ICD-10-CM | POA: Diagnosis not present

## 2021-06-25 DIAGNOSIS — Z03818 Encounter for observation for suspected exposure to other biological agents ruled out: Secondary | ICD-10-CM | POA: Diagnosis not present

## 2021-06-25 DIAGNOSIS — R0981 Nasal congestion: Secondary | ICD-10-CM | POA: Diagnosis not present

## 2021-07-13 DIAGNOSIS — H2513 Age-related nuclear cataract, bilateral: Secondary | ICD-10-CM | POA: Diagnosis not present

## 2021-07-13 DIAGNOSIS — E119 Type 2 diabetes mellitus without complications: Secondary | ICD-10-CM | POA: Diagnosis not present

## 2021-07-13 DIAGNOSIS — H18513 Endothelial corneal dystrophy, bilateral: Secondary | ICD-10-CM | POA: Diagnosis not present

## 2021-07-13 DIAGNOSIS — H31091 Other chorioretinal scars, right eye: Secondary | ICD-10-CM | POA: Diagnosis not present

## 2021-07-13 DIAGNOSIS — H04123 Dry eye syndrome of bilateral lacrimal glands: Secondary | ICD-10-CM | POA: Diagnosis not present

## 2021-07-13 DIAGNOSIS — Z794 Long term (current) use of insulin: Secondary | ICD-10-CM | POA: Diagnosis not present

## 2021-07-15 DIAGNOSIS — E1165 Type 2 diabetes mellitus with hyperglycemia: Secondary | ICD-10-CM | POA: Diagnosis not present

## 2021-07-15 DIAGNOSIS — E785 Hyperlipidemia, unspecified: Secondary | ICD-10-CM | POA: Diagnosis not present

## 2021-07-15 DIAGNOSIS — Z79899 Other long term (current) drug therapy: Secondary | ICD-10-CM | POA: Diagnosis not present

## 2021-08-05 DIAGNOSIS — M4135 Thoracogenic scoliosis, thoracolumbar region: Secondary | ICD-10-CM | POA: Diagnosis not present

## 2021-08-27 DIAGNOSIS — K219 Gastro-esophageal reflux disease without esophagitis: Secondary | ICD-10-CM | POA: Diagnosis not present

## 2021-08-27 DIAGNOSIS — K5130 Ulcerative (chronic) rectosigmoiditis without complications: Secondary | ICD-10-CM | POA: Diagnosis not present

## 2021-09-01 DIAGNOSIS — X32XXXA Exposure to sunlight, initial encounter: Secondary | ICD-10-CM | POA: Diagnosis not present

## 2021-09-01 DIAGNOSIS — L570 Actinic keratosis: Secondary | ICD-10-CM | POA: Diagnosis not present

## 2021-09-01 DIAGNOSIS — L400 Psoriasis vulgaris: Secondary | ICD-10-CM | POA: Diagnosis not present

## 2021-09-01 DIAGNOSIS — D225 Melanocytic nevi of trunk: Secondary | ICD-10-CM | POA: Diagnosis not present

## 2021-09-01 DIAGNOSIS — D485 Neoplasm of uncertain behavior of skin: Secondary | ICD-10-CM | POA: Diagnosis not present

## 2021-09-01 DIAGNOSIS — Z1283 Encounter for screening for malignant neoplasm of skin: Secondary | ICD-10-CM | POA: Diagnosis not present

## 2021-09-04 DIAGNOSIS — M4135 Thoracogenic scoliosis, thoracolumbar region: Secondary | ICD-10-CM | POA: Diagnosis not present

## 2021-09-16 DIAGNOSIS — E1165 Type 2 diabetes mellitus with hyperglycemia: Secondary | ICD-10-CM | POA: Diagnosis not present

## 2021-10-05 DIAGNOSIS — J40 Bronchitis, not specified as acute or chronic: Secondary | ICD-10-CM | POA: Diagnosis not present

## 2021-10-05 DIAGNOSIS — F339 Major depressive disorder, recurrent, unspecified: Secondary | ICD-10-CM | POA: Diagnosis not present

## 2021-10-05 DIAGNOSIS — E785 Hyperlipidemia, unspecified: Secondary | ICD-10-CM | POA: Diagnosis not present

## 2021-10-05 DIAGNOSIS — G894 Chronic pain syndrome: Secondary | ICD-10-CM | POA: Diagnosis not present

## 2021-10-05 DIAGNOSIS — I10 Essential (primary) hypertension: Secondary | ICD-10-CM | POA: Diagnosis not present

## 2021-10-05 DIAGNOSIS — J4520 Mild intermittent asthma, uncomplicated: Secondary | ICD-10-CM | POA: Diagnosis not present

## 2021-10-18 DIAGNOSIS — E1165 Type 2 diabetes mellitus with hyperglycemia: Secondary | ICD-10-CM | POA: Diagnosis not present

## 2021-10-18 DIAGNOSIS — Z794 Long term (current) use of insulin: Secondary | ICD-10-CM | POA: Diagnosis not present

## 2021-11-10 DIAGNOSIS — J324 Chronic pansinusitis: Secondary | ICD-10-CM | POA: Diagnosis not present

## 2021-11-10 DIAGNOSIS — J338 Other polyp of sinus: Secondary | ICD-10-CM | POA: Diagnosis not present

## 2021-11-10 DIAGNOSIS — R0982 Postnasal drip: Secondary | ICD-10-CM | POA: Diagnosis not present

## 2021-11-10 DIAGNOSIS — J343 Hypertrophy of nasal turbinates: Secondary | ICD-10-CM | POA: Diagnosis not present

## 2021-12-07 DIAGNOSIS — E1165 Type 2 diabetes mellitus with hyperglycemia: Secondary | ICD-10-CM | POA: Diagnosis not present

## 2021-12-08 DIAGNOSIS — E785 Hyperlipidemia, unspecified: Secondary | ICD-10-CM | POA: Diagnosis not present

## 2021-12-08 DIAGNOSIS — I10 Essential (primary) hypertension: Secondary | ICD-10-CM | POA: Diagnosis not present

## 2021-12-08 DIAGNOSIS — R5382 Chronic fatigue, unspecified: Secondary | ICD-10-CM | POA: Diagnosis not present

## 2021-12-08 DIAGNOSIS — M791 Myalgia, unspecified site: Secondary | ICD-10-CM | POA: Diagnosis not present

## 2021-12-08 DIAGNOSIS — R112 Nausea with vomiting, unspecified: Secondary | ICD-10-CM | POA: Diagnosis not present

## 2021-12-08 DIAGNOSIS — K219 Gastro-esophageal reflux disease without esophagitis: Secondary | ICD-10-CM | POA: Diagnosis not present

## 2021-12-08 DIAGNOSIS — K3184 Gastroparesis: Secondary | ICD-10-CM | POA: Diagnosis not present

## 2021-12-08 DIAGNOSIS — N3941 Urge incontinence: Secondary | ICD-10-CM | POA: Diagnosis not present

## 2021-12-08 DIAGNOSIS — G4452 New daily persistent headache (NDPH): Secondary | ICD-10-CM | POA: Diagnosis not present

## 2022-01-31 ENCOUNTER — Other Ambulatory Visit: Payer: Self-pay | Admitting: Otolaryngology

## 2022-01-31 DIAGNOSIS — J339 Nasal polyp, unspecified: Secondary | ICD-10-CM

## 2022-01-31 DIAGNOSIS — J329 Chronic sinusitis, unspecified: Secondary | ICD-10-CM

## 2022-02-11 DIAGNOSIS — E1165 Type 2 diabetes mellitus with hyperglycemia: Secondary | ICD-10-CM | POA: Diagnosis not present

## 2022-02-11 DIAGNOSIS — Z794 Long term (current) use of insulin: Secondary | ICD-10-CM | POA: Diagnosis not present

## 2022-02-23 ENCOUNTER — Other Ambulatory Visit: Payer: Medicare Other

## 2022-03-14 DIAGNOSIS — E1165 Type 2 diabetes mellitus with hyperglycemia: Secondary | ICD-10-CM | POA: Diagnosis not present

## 2022-04-04 DIAGNOSIS — E1165 Type 2 diabetes mellitus with hyperglycemia: Secondary | ICD-10-CM | POA: Diagnosis not present

## 2022-04-04 DIAGNOSIS — I10 Essential (primary) hypertension: Secondary | ICD-10-CM | POA: Diagnosis not present

## 2022-04-04 DIAGNOSIS — F419 Anxiety disorder, unspecified: Secondary | ICD-10-CM | POA: Diagnosis not present

## 2022-04-04 DIAGNOSIS — F339 Major depressive disorder, recurrent, unspecified: Secondary | ICD-10-CM | POA: Diagnosis not present

## 2022-04-04 DIAGNOSIS — E785 Hyperlipidemia, unspecified: Secondary | ICD-10-CM | POA: Diagnosis not present

## 2022-05-05 DIAGNOSIS — M25512 Pain in left shoulder: Secondary | ICD-10-CM | POA: Diagnosis not present

## 2022-05-05 DIAGNOSIS — M5136 Other intervertebral disc degeneration, lumbar region: Secondary | ICD-10-CM | POA: Diagnosis not present

## 2022-05-18 DIAGNOSIS — E1165 Type 2 diabetes mellitus with hyperglycemia: Secondary | ICD-10-CM | POA: Diagnosis not present

## 2022-06-01 DIAGNOSIS — E1165 Type 2 diabetes mellitus with hyperglycemia: Secondary | ICD-10-CM | POA: Diagnosis not present

## 2022-06-13 DIAGNOSIS — Z86010 Personal history of colonic polyps: Secondary | ICD-10-CM | POA: Diagnosis not present

## 2022-06-13 DIAGNOSIS — K5100 Ulcerative (chronic) pancolitis without complications: Secondary | ICD-10-CM | POA: Diagnosis not present

## 2022-06-28 DIAGNOSIS — Z794 Long term (current) use of insulin: Secondary | ICD-10-CM | POA: Diagnosis not present

## 2022-06-28 DIAGNOSIS — K5100 Ulcerative (chronic) pancolitis without complications: Secondary | ICD-10-CM | POA: Diagnosis not present

## 2022-06-28 DIAGNOSIS — E1165 Type 2 diabetes mellitus with hyperglycemia: Secondary | ICD-10-CM | POA: Diagnosis not present

## 2022-08-09 DIAGNOSIS — E1165 Type 2 diabetes mellitus with hyperglycemia: Secondary | ICD-10-CM | POA: Diagnosis not present

## 2022-09-19 DIAGNOSIS — Z23 Encounter for immunization: Secondary | ICD-10-CM | POA: Diagnosis not present

## 2022-09-19 DIAGNOSIS — E785 Hyperlipidemia, unspecified: Secondary | ICD-10-CM | POA: Diagnosis not present

## 2022-09-19 DIAGNOSIS — E1169 Type 2 diabetes mellitus with other specified complication: Secondary | ICD-10-CM | POA: Diagnosis not present

## 2022-09-19 DIAGNOSIS — F3341 Major depressive disorder, recurrent, in partial remission: Secondary | ICD-10-CM | POA: Diagnosis not present

## 2022-09-19 DIAGNOSIS — E669 Obesity, unspecified: Secondary | ICD-10-CM | POA: Diagnosis not present

## 2022-09-19 DIAGNOSIS — K219 Gastro-esophageal reflux disease without esophagitis: Secondary | ICD-10-CM | POA: Diagnosis not present

## 2022-09-19 DIAGNOSIS — F419 Anxiety disorder, unspecified: Secondary | ICD-10-CM | POA: Diagnosis not present

## 2022-09-19 DIAGNOSIS — I10 Essential (primary) hypertension: Secondary | ICD-10-CM | POA: Diagnosis not present

## 2022-09-29 DIAGNOSIS — E1165 Type 2 diabetes mellitus with hyperglycemia: Secondary | ICD-10-CM | POA: Diagnosis not present

## 2022-11-03 DIAGNOSIS — F3341 Major depressive disorder, recurrent, in partial remission: Secondary | ICD-10-CM | POA: Diagnosis not present

## 2022-11-23 DIAGNOSIS — H31091 Other chorioretinal scars, right eye: Secondary | ICD-10-CM | POA: Diagnosis not present

## 2022-11-23 DIAGNOSIS — H2513 Age-related nuclear cataract, bilateral: Secondary | ICD-10-CM | POA: Diagnosis not present

## 2022-11-23 DIAGNOSIS — H04123 Dry eye syndrome of bilateral lacrimal glands: Secondary | ICD-10-CM | POA: Diagnosis not present

## 2022-11-23 DIAGNOSIS — Z794 Long term (current) use of insulin: Secondary | ICD-10-CM | POA: Diagnosis not present

## 2022-11-23 DIAGNOSIS — E119 Type 2 diabetes mellitus without complications: Secondary | ICD-10-CM | POA: Diagnosis not present

## 2022-11-30 ENCOUNTER — Other Ambulatory Visit: Payer: Self-pay | Admitting: Otolaryngology

## 2022-11-30 DIAGNOSIS — J339 Nasal polyp, unspecified: Secondary | ICD-10-CM

## 2022-11-30 DIAGNOSIS — J329 Chronic sinusitis, unspecified: Secondary | ICD-10-CM

## 2022-12-28 DIAGNOSIS — E1165 Type 2 diabetes mellitus with hyperglycemia: Secondary | ICD-10-CM | POA: Diagnosis not present

## 2022-12-30 DIAGNOSIS — Z794 Long term (current) use of insulin: Secondary | ICD-10-CM | POA: Diagnosis not present

## 2022-12-30 DIAGNOSIS — R5383 Other fatigue: Secondary | ICD-10-CM | POA: Diagnosis not present

## 2022-12-30 DIAGNOSIS — K5100 Ulcerative (chronic) pancolitis without complications: Secondary | ICD-10-CM | POA: Diagnosis not present

## 2022-12-30 DIAGNOSIS — E1165 Type 2 diabetes mellitus with hyperglycemia: Secondary | ICD-10-CM | POA: Diagnosis not present

## 2023-01-05 ENCOUNTER — Other Ambulatory Visit: Payer: Self-pay | Admitting: Family Medicine

## 2023-01-05 DIAGNOSIS — Z1231 Encounter for screening mammogram for malignant neoplasm of breast: Secondary | ICD-10-CM

## 2023-01-09 DIAGNOSIS — E785 Hyperlipidemia, unspecified: Secondary | ICD-10-CM | POA: Diagnosis not present

## 2023-01-09 DIAGNOSIS — Z794 Long term (current) use of insulin: Secondary | ICD-10-CM | POA: Diagnosis not present

## 2023-01-09 DIAGNOSIS — F419 Anxiety disorder, unspecified: Secondary | ICD-10-CM | POA: Diagnosis not present

## 2023-01-09 DIAGNOSIS — K219 Gastro-esophageal reflux disease without esophagitis: Secondary | ICD-10-CM | POA: Diagnosis not present

## 2023-01-09 DIAGNOSIS — F339 Major depressive disorder, recurrent, unspecified: Secondary | ICD-10-CM | POA: Diagnosis not present

## 2023-01-09 DIAGNOSIS — E1165 Type 2 diabetes mellitus with hyperglycemia: Secondary | ICD-10-CM | POA: Diagnosis not present

## 2023-01-09 DIAGNOSIS — M5136 Other intervertebral disc degeneration, lumbar region: Secondary | ICD-10-CM | POA: Diagnosis not present

## 2023-01-09 DIAGNOSIS — I10 Essential (primary) hypertension: Secondary | ICD-10-CM | POA: Diagnosis not present

## 2023-01-16 NOTE — Progress Notes (Unsigned)
Cardiology Office Note:    Date:  01/19/2023   ID:  [name], DOB 1951-08-25, MRN [id number]  PCP:  Harlan Stains, MD   [place] Providers Cardiologist:  None   Referring MD: Harlan Stains, MD    History of Present Illness:    Yolanda Lopez is a 72 y.o. female with a hx of HTN, HLD, OSA, anxiety, ulcerative colitis and GERD who was referred by Dr. Dema Severin for chest pain.  Patient previously followed by Cardiology at [facility]  with last visit in 2021. Notes reviewed.  Was seen by Dr. Dema Severin on 12/2022 where she reported intermittent chest discomfort prompting referral to Cardiology.  Today, the patient states that she has been having intermittent central chest discomfort that has been ongoing for the past several months. Has been having about 2 episodes per day, lasting a couple of seconds before abating. Symptoms are not usually exertional in nature but has had one episode that was triggered when she was moving quickly. Usually symptoms resolve on their own but she does stop what she is doing to rest to help symptoms abate. No palpitations, SOB, LE edema, orthopnea or PND.   Family History: Dad with CAD (53s), Brother with Afib., paternal uncle with CAD with heart transplant  Past Medical History:  Diagnosis Date   CAD (coronary artery disease)    CKD (chronic kidney disease), stage III ([place])    Essential hypertension, benign    H[place]st plaque    Lumbar radiculopathy    Occipital stroke ([place])    Osteopenia    Other and unspecified hyperlipidemia    PMR (polymyalgia rheumatica) ([place])    Shingles     History reviewed. No pertinent surgical history.  Current Medications: Current Meds  Medication Sig   acetaminophen (TYLENOL) 500 MG tablet Take 500 mg by mouth every 6 (six) hours as needed.   buPROPion (WELLBUTRIN XL) 300 MG 24 hr tablet Take 300 mg by mouth daily.   dexlansoprazole (DEXILANT) 60 MG capsule Take 60 mg by mouth daily.    escitalopram (LEXAPRO) 20 MG tablet Take 20 mg by mouth daily.   olmesartan (BENICAR) 40 MG tablet Take 40 mg by mouth daily.   rosuvastatin (CRESTOR) 20 MG tablet Take 20 mg by mouth daily.   tizanidine (ZANAFLEX) 2 MG capsule Take 2 mg by mouth as needed for muscle spasms.   valACYclovir (VALTREX) 1000 MG tablet Take 1,000 mg by mouth every other day.      Allergies:   Voltaren [diclofenac], Adhesive [tape], Codeine, Meloxicam, Norvasc [amlodipine], Other, Prinivil [lisinopril], and Vioxx [rofecoxib]   Social History   Socioeconomic History   Marital status: Married    Spouse name: Not on file   Number of children: Not on file   Years of education: Not on file   Highest education level: Not on file  Occupational History   Not on file  Tobacco Use   Smoking status: Former    Types: Cigarettes   Smokeless tobacco: Former   Tobacco comments:    quit 1965  Substance and Sexual Activity   Alcohol use: Not Currently    Comment: occassionally   Drug use: Never   Sexual activity: Not on file  Other Topics Concern   Not on file  Social History Narrative   ** Merged History Encounter **       Social Determinants of Health   Financial Resource Strain: Not on file  Food Insecurity: Not on file  Transportation Needs: Not  on file  Physical Activity: Not on file  Stress: Not on file  Social Connections: Not on file     Family History: The patient's family history is not on file.  ROS:   Please see the history of present illness.    All other systems reviewed and are negative.  EKGs/Labs/Other Studies Reviewed:    The following studies were reviewed today: No CV studies  EKG:  EKG is  ordered today.  The ekg ordered today demonstrates NSR, iRBBB, LAFB  Recent Labs: No results found for requested labs within last 365 days.  Recent Lipid Panel No results found for: "CHOL", "TRIG", "HDL", "CHOLHDL", "VLDL", "LDLCALC", "LDLDIRECT"   Risk Assessment/Calculations:                 Physical Exam:    VS:  BP 137/85   Pulse (!) 112   Ht 5' 1"$  (1.549 m)   Wt 161 lb (73 kg)   SpO2 94%   BMI 30.42 kg/m     Wt Readings from Last 3 Encounters:  01/19/23 161 lb (73 kg)     GEN:  Well nourished, well developed in no acute distress HEENT: Normal NECK: No JVD; No carotid bruits CARDIAC: RRR, no murmurs, rubs, gallops RESPIRATORY:  Clear to auscultation without rales, wheezing or rhonchi  ABDOMEN: Soft, non-tender, non-distended MUSCULOSKELETAL:  No edema; No deformity  SKIN: Warm and dry NEUROLOGIC:  Alert and oriented x 3 PSYCHIATRIC:  Normal affect   ASSESSMENT:    1. Precordial pain   2. Primary hypertension   3. Mixed hyperlipidemia   4. Diabetes mellitus with coincident hypertension ([place])    PLAN:    In order of problems listed above:  #Chest Pain: Patient having episodes of substernal chest discomfort that is occurring about 2x/day. Although symptoms are atypical in nature, she did have an episode triggered by exertion recently that was relieved with rest. Given risk factors and symptoms, will check coronary CTA and TTE for further evaluation.  -Check TTE -Check coronary CTA  #HTN: Blood pressure well controlled at home. -Continue olmesartan 2m daily  #HLD: -Continue crestor 243mdaily -Lipids managed by PCP  #DMII: On Insulin. Followed by Dr. WhDema Severin        Medication Adjustments/Labs and Tests Ordered: Current medicines are reviewed at length with the patient today.  Concerns regarding medicines are outlined above.  Orders Placed This Encounter  Procedures   CT CORONARY MORPH W/CTA COR W/SCORE W/CA W/CM &/OR WO/CM   Basic metabolic panel   EKG [id number] ECHOCARDIOGRAM COMPLETE   Meds ordered this encounter  Medications   metoprolol tartrate (LOPRESSOR) 100 MG tablet    Sig: Take 1 tablet (100 mg total) by mouth once for 1 dose. Take 90-120 minutes prior to scan.    Dispense:  1 tablet    Refill:  0     Patient Instructions  Medication Instructions:   Your physician recommends that you continue on your current medications as directed. Please refer to the Current Medication list given to you today.  *If you need a refill on your cardiac medications before your next appointment, please call your pharmacy*   Lab Work:  TODAY--BMET  If you have labs (blood work) drawn today and your tests are completely normal, you will receive your results only by: My[place]if you have MyChart) OR A paper copy in the mail If you have any lab test that is abnormal or we need to change  your treatment, we will call you to review the results.   Testing/Procedures:  Your physician has requested that you have an echocardiogram. Echocardiography is a painless test that uses sound waves to create images of your heart. It provides your doctor with information about the size and shape of your heart and how well your heart's chambers and valves are working. This procedure takes approximately one hour. There are no restrictions for this procedure. Please do NOT wear cologne, perfume, aftershave, or lotions (deodorant is allowed). Please arrive 15 minutes prior to your appointment time.      Your cardiac CT will be scheduled at one of the below locations:   [facility] [address] [place], [place] [address] [phone]   If scheduled at [facility], please arrive at the [facility] and Children's Entrance (Entrance C2) of [facility] 30 minutes prior to test start time. You can use the FREE valet parking offered at entrance C (encouraged to control the heart rate for the test)  Proceed to the [facility] Radiology Department (first floor) to check-in and test prep.  All radiology patients and guests should use entrance C2 at [facility], accessed from [facility], even though the hospital's physical address listed is [address].     Please follow these instructions carefully (unless otherwise directed):   On the Night Before the Test: Be sure to Drink plenty of water. Do not consume any caffeinated/decaffeinated beverages or chocolate 12 hours prior to your test. Do not take any antihistamines 12 hours prior to your test.   On the Day of the Test: Drink plenty of water until 1 hour prior to the test. Do not eat any food 1 hour prior to test. You may take your regular medications prior to the test.  Take metoprolol 100 MG BY MOUTH (Lopressor) two hours prior to test. If you take Furosemide/Hydrochlorothiazide/Spironolactone, please HOLD on the morning of the test. FEMALES- please wear underwire-free bra if available, avoid dresses & tight clothing        After the Test: Drink plenty of water. After receiving IV contrast, you may experience a mild flushed feeling. This is normal. On occasion, you may experience a mild rash up to 24 hours after the test. This is not dangerous. If this occurs, you can take Benadryl 25 mg and increase your fluid intake. If you experience trouble breathing, this can be serious. If it is severe call 911 IMMEDIATELY. If it is mild, please call our office. If you take any of these medications: Glipizide/Metformin, Avandament, Glucavance, please do not take 48 hours after completing test unless otherwise instructed.  We will call to schedule your test 2-4 weeks out understanding that some insurance companies will need an authorization prior to the service being performed.   For non-scheduling related questions, please contact the cardiac imaging nurse navigator should you have any questions/concerns: Marchia Bond, Cardiac Imaging Nurse Navigator Gordy Clement, Cardiac Imaging Nurse Navigator [facility] Direct Office Dial: [phone]   For scheduling needs, including cancellations and rescheduling, please call [place],  [phone].    Follow-Up: At [facility], you and your health needs are our priority.  As part of our continuing mission to provide you with exceptional heart care, we have created designated Provider Care Teams.  These Care Teams include your primary Cardiologist (physician) and Advanced Practice Providers (APPs -  Physician Assistants and Nurse Practitioners) who all work together  to provide you with the care you need, when you need it.  We recommend signing up for the patient portal called "MyChart".  Sign up information is provided on this After Visit Summary.  MyChart is used to connect with patients for Virtual Visits (Telemedicine).  Patients are able to view lab/test results, encounter notes, upcoming appointments, etc.  Non-urgent messages can be sent to your provider as well.   To learn more about what you can do with MyChart, go to [email or web address].    Your next appointment:   6 month(s)  Provider:   DR. Johney Frame     Signed, Freada Bergeron, MD  01/19/2023 4:32 PM    [place]

## 2023-01-19 ENCOUNTER — Encounter: Payer: Self-pay | Admitting: Cardiology

## 2023-01-19 ENCOUNTER — Ambulatory Visit: Payer: Medicare PPO | Attending: Cardiology | Admitting: Cardiology

## 2023-01-19 VITALS — BP 137/85 | HR 112 | Ht 61.0 in | Wt 161.0 lb

## 2023-01-19 DIAGNOSIS — E782 Mixed hyperlipidemia: Secondary | ICD-10-CM | POA: Diagnosis not present

## 2023-01-19 DIAGNOSIS — R072 Precordial pain: Secondary | ICD-10-CM | POA: Diagnosis not present

## 2023-01-19 DIAGNOSIS — E119 Type 2 diabetes mellitus without complications: Secondary | ICD-10-CM

## 2023-01-19 DIAGNOSIS — I10 Essential (primary) hypertension: Secondary | ICD-10-CM

## 2023-01-19 MED ORDER — METOPROLOL TARTRATE 100 MG PO TABS: 100.0000 mg | ORAL_TABLET | Freq: Once | ORAL | 0 refills | Status: DC

## 2023-01-19 NOTE — Patient Instructions (Signed)
Medication Instructions:   Your physician recommends that you continue on your current medications as directed. Please refer to the Current Medication list given to you today.  *If you need a refill on your cardiac medications before your next appointment, please call your pharmacy*   Lab Work:  TODAY--BMET  If you have labs (blood work) drawn today and your tests are completely normal, you will receive your results only by: [place] (if you have MyChart) OR A paper copy in the mail If you have any lab test that is abnormal or we need to change your treatment, we will call you to review the results.   Testing/Procedures:  Your physician has requested that you have an echocardiogram. Echocardiography is a painless test that uses sound waves to create images of your heart. It provides your doctor with information about the size and shape of your heart and how well your heart's chambers and valves are working. This procedure takes approximately one hour. There are no restrictions for this procedure. Please do NOT wear cologne, perfume, aftershave, or lotions (deodorant is allowed). Please arrive 15 minutes prior to your appointment time.      Your cardiac CT will be scheduled at one of the below locations:   [facility] [address] [place], [place] [address] [phone]   If scheduled at [facility], please arrive at the [facility] and Children's Entrance (Entrance C2) of [facility] 30 minutes prior to test start time. You can use the FREE valet parking offered at entrance C (encouraged to control the heart rate for the test)  Proceed to the [facility] Radiology Department (first floor) to check-in and test prep.  All radiology patients and guests should use entrance C2 at [facility], accessed from [facility], even though the hospital's physical address listed is [address]     Please follow these  instructions carefully (unless otherwise directed):   On the Night Before the Test: Be sure to Drink plenty of water. Do not consume any caffeinated/decaffeinated beverages or chocolate 12 hours prior to your test. Do not take any antihistamines 12 hours prior to your test.   On the Day of the Test: Drink plenty of water until 1 hour prior to the test. Do not eat any food 1 hour prior to test. You may take your regular medications prior to the test.  Take metoprolol 100 MG BY MOUTH (Lopressor) two hours prior to test. If you take Furosemide/Hydrochlorothiazide/Spironolactone, please HOLD on the morning of the test. FEMALES- please wear underwire-free bra if available, avoid dresses & tight clothing        After the Test: Drink plenty of water. After receiving IV contrast, you may experience a mild flushed feeling. This is normal. On occasion, you may experience a mild rash up to 24 hours after the test. This is not dangerous. If this occurs, you can take Benadryl 25 mg and increase your fluid intake. If you experience trouble breathing, this can be serious. If it is severe call 911 IMMEDIATELY. If it is mild, please call our office. If you take any of these medications: Glipizide/Metformin, Avandament, Glucavance, please do not take 48 hours after completing test unless otherwise instructed.  We will call to schedule your test 2-4 weeks out understanding that some insurance companies will need an authorization prior to the service being performed.   For non-scheduling related questions, please contact the cardiac imaging nurse navigator should you have  any questions/concerns: Marchia Bond, Cardiac Imaging Nurse Navigator Gordy Clement, Cardiac Imaging Nurse Navigator [facility] Direct Office Dial: [phone]   For scheduling needs, including cancellations and rescheduling, please call [place], [phone].    Follow-Up: At [facility],  you and your health needs are our priority.  As part of our continuing mission to provide you with exceptional heart care, we have created designated Provider Care Teams.  These Care Teams include your primary Cardiologist (physician) and Advanced Practice Providers (APPs -  Physician Assistants and Nurse Practitioners) who all work together to provide you with the care you need, when you need it.  We recommend signing up for the patient portal called "MyChart".  Sign up information is provided on this After Visit Summary.  MyChart is used to connect with patients for Virtual Visits (Telemedicine).  Patients are able to view lab/test results, encounter notes, upcoming appointments, etc.  Non-urgent messages can be sent to your provider as well.   To learn more about what you can do with MyChart, go to [email or web address].    Your next appointment:   6 month(s)  Provider:   DR. Johney Frame

## 2023-01-20 ENCOUNTER — Telehealth: Payer: Self-pay | Admitting: *Deleted

## 2023-01-20 LAB — BASIC METABOLIC PANEL
BUN/Creatinine Ratio: 30 — ABNORMAL HIGH (ref 12–28)
BUN: 19 mg/dL (ref 8–27)
CO2: 20 mmol/L (ref 20–29)
Calcium: 10.1 mg/dL (ref 8.7–10.3)
Chloride: 104 mmol/L (ref 96–106)
Creatinine, Ser: 0.63 mg/dL (ref 0.57–1.00)
Glucose: 220 mg/dL — ABNORMAL HIGH (ref 70–99)
Potassium: 4.5 mmol/L (ref 3.5–5.2)
Sodium: 139 mmol/L (ref 134–144)
eGFR: 95 mL/min/{1.73_m2} (ref >59)

## 2023-01-20 NOTE — Telephone Encounter (Signed)
-----   Message from Melony Overly sent at 01/20/2023  2:20 PM EST ----- Regarding: ct heart Scheduled 02/02/23 at 4:00

## 2023-02-01 ENCOUNTER — Other Ambulatory Visit: Payer: Medicare Other

## 2023-02-01 ENCOUNTER — Ambulatory Visit
Admission: RE | Admit: 2023-02-01 | Discharge: 2023-02-01 | Disposition: A | Discharge status: Home / Self Care | Payer: Medicare PPO | Source: Ambulatory Visit | Attending: Otolaryngology | Admitting: Otolaryngology

## 2023-02-01 DIAGNOSIS — J339 Nasal polyp, unspecified: Secondary | ICD-10-CM | POA: Diagnosis not present

## 2023-02-01 DIAGNOSIS — J3489 Other specified disorders of nose and nasal sinuses: Secondary | ICD-10-CM | POA: Diagnosis not present

## 2023-02-01 DIAGNOSIS — J329 Chronic sinusitis, unspecified: Secondary | ICD-10-CM

## 2023-02-02 ENCOUNTER — Ambulatory Visit (HOSPITAL_COMMUNITY): Payer: Medicare Other

## 2023-02-02 DIAGNOSIS — F3341 Major depressive disorder, recurrent, in partial remission: Secondary | ICD-10-CM | POA: Diagnosis not present

## 2023-02-03 ENCOUNTER — Other Ambulatory Visit (HOSPITAL_COMMUNITY): Payer: Self-pay | Admitting: *Deleted

## 2023-02-03 DIAGNOSIS — R072 Precordial pain: Secondary | ICD-10-CM

## 2023-02-03 MED ORDER — METOPROLOL TARTRATE 100 MG PO TABS: 100.0000 mg | ORAL_TABLET | Freq: Once | ORAL | 0 refills | Status: AC

## 2023-02-06 ENCOUNTER — Telehealth (HOSPITAL_COMMUNITY): Payer: Self-pay | Admitting: Emergency Medicine

## 2023-02-06 NOTE — Telephone Encounter (Signed)
Reaching out to patient to offer assistance regarding upcoming cardiac imaging study; pt verbalizes understanding of appt date/time, parking situation and where to check in, pre-test NPO status and medications ordered, and verified current allergies; name and call back number provided for further questions should they arise Marchia Bond RN Navigator Cardiac Imaging [facility] [phone] office [phone] cell  Arrival [address] WC entrance '100mg'$  metoprolol tartrate Delicate skin, 2 sticks only Aware contrast / nitro

## 2023-02-07 ENCOUNTER — Ambulatory Visit (HOSPITAL_COMMUNITY)
Admission: RE | Admit: 2023-02-07 | Discharge: 2023-02-07 | Disposition: A | Discharge status: Home / Self Care | Payer: Medicare PPO | Source: Ambulatory Visit | Attending: Cardiology | Admitting: Cardiology

## 2023-02-07 DIAGNOSIS — R072 Precordial pain: Secondary | ICD-10-CM | POA: Diagnosis not present

## 2023-02-07 MED ORDER — NITROGLYCERIN 0.4 MG SL SUBL
SUBLINGUAL_TABLET | SUBLINGUAL | Status: AC
  Administered 2023-02-07: 0.8 mg via SUBLINGUAL
  Filled 2023-02-07: qty 2

## 2023-02-07 MED ORDER — NITROGLYCERIN 0.4 MG SL SUBL
0.8000 mg | SUBLINGUAL_TABLET | Freq: Once | SUBLINGUAL | Status: AC
Start: 2023-02-07 — End: 2023-02-07

## 2023-02-07 MED ORDER — METOPROLOL TARTRATE 5 MG/5ML IV SOLN
INTRAVENOUS | Status: AC
  Administered 2023-02-07: 10 mg via INTRAVENOUS
  Filled 2023-02-07: qty 20

## 2023-02-07 MED ORDER — METOPROLOL TARTRATE 5 MG/5ML IV SOLN: 10.0000 mg | Freq: Once | INTRAVENOUS | Status: AC

## 2023-02-07 MED ORDER — IOHEXOL 350 MG/ML SOLN
95.0000 mL | Freq: Once | INTRAVENOUS | Status: AC | PRN
  Administered 2023-02-07: 95 mL via INTRAVENOUS

## 2023-02-08 ENCOUNTER — Telehealth: Payer: Self-pay | Admitting: Cardiology

## 2023-02-08 NOTE — Telephone Encounter (Signed)
The patient has been notified of the result and verbalized understanding.  All questions (if any) were answered. Gershon Crane, LPN [phone] PM

## 2023-02-08 NOTE — Telephone Encounter (Signed)
Pt is returning call about CT results

## 2023-02-14 ENCOUNTER — Telehealth: Payer: Self-pay | Admitting: Cardiology

## 2023-02-14 NOTE — Telephone Encounter (Signed)
Pts echo has been cancelled for tomorrow and rescheduled for 03/15/23 at 0935. Pt made aware of appt date and time by Echo Scheduler.

## 2023-02-14 NOTE — Telephone Encounter (Signed)
Patient is requesting to speak with Dr. Jacolyn Reedy nurse. She states she will have to reschedule her echo tomorrow due to a conflict with her husband. She would like to know if it would be alright to put it off for a few weeks, until the next available, 4/05.

## 2023-02-14 NOTE — Telephone Encounter (Signed)
Left the pt a detailed message that I will have Edmon Crape Echo Scheduler give her a call back today to get her echo for tomorrow cancelled and rescheduled to be done in early April.  Advised the pt in the message to be on the listen out for Lattie Haw to call her back soon.

## 2023-02-15 ENCOUNTER — Other Ambulatory Visit (HOSPITAL_COMMUNITY): Payer: Medicare Other

## 2023-02-24 ENCOUNTER — Ambulatory Visit: Payer: Medicare Other

## 2023-03-01 ENCOUNTER — Ambulatory Visit
Admission: RE | Admit: 2023-03-01 | Discharge: 2023-03-01 | Disposition: A | Discharge status: Home / Self Care | Payer: Medicare PPO | Source: Ambulatory Visit | Attending: Family Medicine | Admitting: Family Medicine

## 2023-03-01 DIAGNOSIS — J324 Chronic pansinusitis: Secondary | ICD-10-CM | POA: Diagnosis not present

## 2023-03-01 DIAGNOSIS — Z1231 Encounter for screening mammogram for malignant neoplasm of breast: Secondary | ICD-10-CM

## 2023-03-01 DIAGNOSIS — J343 Hypertrophy of nasal turbinates: Secondary | ICD-10-CM | POA: Diagnosis not present

## 2023-03-01 DIAGNOSIS — J338 Other polyp of sinus: Secondary | ICD-10-CM | POA: Diagnosis not present

## 2023-03-05 DIAGNOSIS — F3341 Major depressive disorder, recurrent, in partial remission: Secondary | ICD-10-CM | POA: Diagnosis not present

## 2023-03-15 ENCOUNTER — Ambulatory Visit (HOSPITAL_COMMUNITY): Payer: Medicare PPO

## 2023-03-15 ENCOUNTER — Telehealth (HOSPITAL_COMMUNITY): Payer: Self-pay | Admitting: Cardiology

## 2023-03-15 DIAGNOSIS — E119 Type 2 diabetes mellitus without complications: Secondary | ICD-10-CM | POA: Diagnosis not present

## 2023-03-15 DIAGNOSIS — U071 COVID-19: Secondary | ICD-10-CM | POA: Diagnosis not present

## 2023-03-15 DIAGNOSIS — I10 Essential (primary) hypertension: Secondary | ICD-10-CM | POA: Diagnosis not present

## 2023-03-15 NOTE — Telephone Encounter (Signed)
Patient called and cancelled echocardiogram for 03/15/23 due to she had COVID. She will call back to rescheduled when she feels better.  Order will be removed from echo WQ. When patient calls back to reschedule we will reinstate the order. Thank you

## 2023-03-15 NOTE — Telephone Encounter (Signed)
Will send this information to Dr. Pemberton as an FYI only. 

## 2023-03-28 DIAGNOSIS — E1165 Type 2 diabetes mellitus with hyperglycemia: Secondary | ICD-10-CM | POA: Diagnosis not present

## 2023-04-13 DIAGNOSIS — E119 Type 2 diabetes mellitus without complications: Secondary | ICD-10-CM | POA: Diagnosis not present

## 2023-04-13 DIAGNOSIS — E785 Hyperlipidemia, unspecified: Secondary | ICD-10-CM | POA: Diagnosis not present

## 2023-04-13 DIAGNOSIS — M5136 Other intervertebral disc degeneration, lumbar region: Secondary | ICD-10-CM | POA: Diagnosis not present

## 2023-04-13 DIAGNOSIS — I10 Essential (primary) hypertension: Secondary | ICD-10-CM | POA: Diagnosis not present

## 2023-04-13 DIAGNOSIS — K219 Gastro-esophageal reflux disease without esophagitis: Secondary | ICD-10-CM | POA: Diagnosis not present

## 2023-04-13 DIAGNOSIS — F419 Anxiety disorder, unspecified: Secondary | ICD-10-CM | POA: Diagnosis not present

## 2023-04-13 DIAGNOSIS — Z794 Long term (current) use of insulin: Secondary | ICD-10-CM | POA: Diagnosis not present

## 2023-04-13 DIAGNOSIS — E669 Obesity, unspecified: Secondary | ICD-10-CM | POA: Diagnosis not present

## 2023-04-13 DIAGNOSIS — F331 Major depressive disorder, recurrent, moderate: Secondary | ICD-10-CM | POA: Diagnosis not present

## 2023-05-08 ENCOUNTER — Telehealth: Payer: Self-pay | Admitting: Cardiology

## 2023-05-08 DIAGNOSIS — R072 Precordial pain: Secondary | ICD-10-CM

## 2023-05-08 DIAGNOSIS — I10 Essential (primary) hypertension: Secondary | ICD-10-CM

## 2023-05-08 DIAGNOSIS — E782 Mixed hyperlipidemia: Secondary | ICD-10-CM

## 2023-05-08 NOTE — Telephone Encounter (Signed)
Patient wants new orders to have Echo test.

## 2023-05-08 NOTE — Telephone Encounter (Signed)
New echo order placed.    Will send a staff message to our Echo Scheduler to call the pt back to arrange this appt.   Pt aware of this and agrees with this plan.

## 2023-05-10 NOTE — Telephone Encounter (Signed)
RE: ECHO PER DR. Shari Prows Received: Mervyn Skeeters, LPN Patient is scheduled for 05/15/23.

## 2023-05-15 ENCOUNTER — Ambulatory Visit (HOSPITAL_COMMUNITY): Payer: Medicare PPO | Attending: Internal Medicine

## 2023-05-15 DIAGNOSIS — I10 Essential (primary) hypertension: Secondary | ICD-10-CM

## 2023-05-15 DIAGNOSIS — E782 Mixed hyperlipidemia: Secondary | ICD-10-CM

## 2023-05-15 DIAGNOSIS — R072 Precordial pain: Secondary | ICD-10-CM

## 2023-05-15 LAB — ECHOCARDIOGRAM COMPLETE
Area-P 1/2: 3.17 cm2
P 1/2 time: 511 msec
S' Lateral: 2.5 cm

## 2023-05-16 DIAGNOSIS — K5190 Ulcerative colitis, unspecified, without complications: Secondary | ICD-10-CM | POA: Diagnosis not present

## 2023-06-26 DIAGNOSIS — E1165 Type 2 diabetes mellitus with hyperglycemia: Secondary | ICD-10-CM | POA: Diagnosis not present

## 2023-07-11 DIAGNOSIS — K5100 Ulcerative (chronic) pancolitis without complications: Secondary | ICD-10-CM | POA: Diagnosis not present

## 2023-07-11 DIAGNOSIS — Z09 Encounter for follow-up examination after completed treatment for conditions other than malignant neoplasm: Secondary | ICD-10-CM | POA: Diagnosis not present

## 2023-07-11 DIAGNOSIS — D122 Benign neoplasm of ascending colon: Secondary | ICD-10-CM | POA: Diagnosis not present

## 2023-07-11 DIAGNOSIS — K6389 Other specified diseases of intestine: Secondary | ICD-10-CM | POA: Diagnosis not present

## 2023-07-11 DIAGNOSIS — K5289 Other specified noninfective gastroenteritis and colitis: Secondary | ICD-10-CM | POA: Diagnosis not present

## 2023-07-11 DIAGNOSIS — Z86010 Personal history of colonic polyps: Secondary | ICD-10-CM | POA: Diagnosis not present

## 2023-07-11 DIAGNOSIS — D123 Benign neoplasm of transverse colon: Secondary | ICD-10-CM | POA: Diagnosis not present

## 2023-07-11 DIAGNOSIS — K648 Other hemorrhoids: Secondary | ICD-10-CM | POA: Diagnosis not present

## 2023-07-13 DIAGNOSIS — K5289 Other specified noninfective gastroenteritis and colitis: Secondary | ICD-10-CM | POA: Diagnosis not present

## 2023-07-13 DIAGNOSIS — D123 Benign neoplasm of transverse colon: Secondary | ICD-10-CM | POA: Diagnosis not present

## 2023-08-18 DIAGNOSIS — M5136 Other intervertebral disc degeneration, lumbar region: Secondary | ICD-10-CM | POA: Diagnosis not present

## 2023-08-18 DIAGNOSIS — M545 Low back pain, unspecified: Secondary | ICD-10-CM | POA: Diagnosis not present

## 2023-08-23 ENCOUNTER — Ambulatory Visit: Payer: Medicare Other | Admitting: Cardiology

## 2023-09-24 DIAGNOSIS — E1165 Type 2 diabetes mellitus with hyperglycemia: Secondary | ICD-10-CM | POA: Diagnosis not present

## 2023-10-02 DIAGNOSIS — K5100 Ulcerative (chronic) pancolitis without complications: Secondary | ICD-10-CM | POA: Diagnosis not present

## 2023-10-02 DIAGNOSIS — R5383 Other fatigue: Secondary | ICD-10-CM | POA: Diagnosis not present

## 2023-10-02 DIAGNOSIS — Z794 Long term (current) use of insulin: Secondary | ICD-10-CM | POA: Diagnosis not present

## 2023-10-02 DIAGNOSIS — E1165 Type 2 diabetes mellitus with hyperglycemia: Secondary | ICD-10-CM | POA: Diagnosis not present

## 2023-10-06 DIAGNOSIS — Z1283 Encounter for screening for malignant neoplasm of skin: Secondary | ICD-10-CM | POA: Diagnosis not present

## 2023-10-06 DIAGNOSIS — L570 Actinic keratosis: Secondary | ICD-10-CM | POA: Diagnosis not present

## 2023-10-06 DIAGNOSIS — L218 Other seborrheic dermatitis: Secondary | ICD-10-CM | POA: Diagnosis not present

## 2023-10-06 DIAGNOSIS — X32XXXD Exposure to sunlight, subsequent encounter: Secondary | ICD-10-CM | POA: Diagnosis not present

## 2023-10-06 DIAGNOSIS — L400 Psoriasis vulgaris: Secondary | ICD-10-CM | POA: Diagnosis not present

## 2023-10-06 DIAGNOSIS — D225 Melanocytic nevi of trunk: Secondary | ICD-10-CM | POA: Diagnosis not present

## 2023-12-21 DIAGNOSIS — E1165 Type 2 diabetes mellitus with hyperglycemia: Secondary | ICD-10-CM | POA: Diagnosis not present

## 2023-12-21 DIAGNOSIS — Z794 Long term (current) use of insulin: Secondary | ICD-10-CM | POA: Diagnosis not present

## 2023-12-23 DIAGNOSIS — E1165 Type 2 diabetes mellitus with hyperglycemia: Secondary | ICD-10-CM | POA: Diagnosis not present

## 2024-01-05 DIAGNOSIS — E1165 Type 2 diabetes mellitus with hyperglycemia: Secondary | ICD-10-CM | POA: Diagnosis not present

## 2024-01-05 DIAGNOSIS — Z794 Long term (current) use of insulin: Secondary | ICD-10-CM | POA: Diagnosis not present

## 2024-02-21 DIAGNOSIS — F331 Major depressive disorder, recurrent, moderate: Secondary | ICD-10-CM | POA: Diagnosis not present

## 2024-02-21 DIAGNOSIS — F419 Anxiety disorder, unspecified: Secondary | ICD-10-CM | POA: Diagnosis not present

## 2024-02-21 DIAGNOSIS — Z0000 Encounter for general adult medical examination without abnormal findings: Secondary | ICD-10-CM | POA: Diagnosis not present

## 2024-02-21 DIAGNOSIS — I10 Essential (primary) hypertension: Secondary | ICD-10-CM | POA: Diagnosis not present

## 2024-02-21 DIAGNOSIS — Z79899 Other long term (current) drug therapy: Secondary | ICD-10-CM | POA: Diagnosis not present

## 2024-02-21 DIAGNOSIS — G4733 Obstructive sleep apnea (adult) (pediatric): Secondary | ICD-10-CM | POA: Diagnosis not present

## 2024-02-21 DIAGNOSIS — Z794 Long term (current) use of insulin: Secondary | ICD-10-CM | POA: Diagnosis not present

## 2024-02-21 DIAGNOSIS — E785 Hyperlipidemia, unspecified: Secondary | ICD-10-CM | POA: Diagnosis not present

## 2024-02-21 DIAGNOSIS — E559 Vitamin D deficiency, unspecified: Secondary | ICD-10-CM | POA: Diagnosis not present

## 2024-02-21 DIAGNOSIS — E1169 Type 2 diabetes mellitus with other specified complication: Secondary | ICD-10-CM | POA: Diagnosis not present

## 2024-02-21 DIAGNOSIS — L409 Psoriasis, unspecified: Secondary | ICD-10-CM | POA: Diagnosis not present

## 2024-03-22 DIAGNOSIS — E1165 Type 2 diabetes mellitus with hyperglycemia: Secondary | ICD-10-CM | POA: Diagnosis not present

## 2024-05-24 DIAGNOSIS — Z03818 Encounter for observation for suspected exposure to other biological agents ruled out: Secondary | ICD-10-CM | POA: Diagnosis not present

## 2024-05-24 DIAGNOSIS — R52 Pain, unspecified: Secondary | ICD-10-CM | POA: Diagnosis not present

## 2024-05-24 DIAGNOSIS — R5383 Other fatigue: Secondary | ICD-10-CM | POA: Diagnosis not present

## 2024-05-24 DIAGNOSIS — R0981 Nasal congestion: Secondary | ICD-10-CM | POA: Diagnosis not present

## 2024-05-24 DIAGNOSIS — M791 Myalgia, unspecified site: Secondary | ICD-10-CM | POA: Diagnosis not present

## 2024-05-24 DIAGNOSIS — E785 Hyperlipidemia, unspecified: Secondary | ICD-10-CM | POA: Diagnosis not present

## 2024-05-24 DIAGNOSIS — R110 Nausea: Secondary | ICD-10-CM | POA: Diagnosis not present

## 2024-06-18 DIAGNOSIS — Z09 Encounter for follow-up examination after completed treatment for conditions other than malignant neoplasm: Secondary | ICD-10-CM | POA: Diagnosis not present

## 2024-06-18 DIAGNOSIS — Z860101 Personal history of adenomatous and serrated colon polyps: Secondary | ICD-10-CM | POA: Diagnosis not present

## 2024-06-18 DIAGNOSIS — Z8719 Personal history of other diseases of the digestive system: Secondary | ICD-10-CM | POA: Diagnosis not present

## 2024-06-20 DIAGNOSIS — E1165 Type 2 diabetes mellitus with hyperglycemia: Secondary | ICD-10-CM | POA: Diagnosis not present

## 2024-07-02 DIAGNOSIS — Z794 Long term (current) use of insulin: Secondary | ICD-10-CM | POA: Diagnosis not present

## 2024-07-02 DIAGNOSIS — E1165 Type 2 diabetes mellitus with hyperglycemia: Secondary | ICD-10-CM | POA: Diagnosis not present

## 2024-07-16 DIAGNOSIS — E1165 Type 2 diabetes mellitus with hyperglycemia: Secondary | ICD-10-CM | POA: Diagnosis not present

## 2024-07-24 DIAGNOSIS — Z794 Long term (current) use of insulin: Secondary | ICD-10-CM | POA: Diagnosis not present

## 2024-07-24 DIAGNOSIS — E119 Type 2 diabetes mellitus without complications: Secondary | ICD-10-CM | POA: Diagnosis not present

## 2024-09-24 DIAGNOSIS — E119 Type 2 diabetes mellitus without complications: Secondary | ICD-10-CM | POA: Diagnosis not present

## 2024-09-24 DIAGNOSIS — Z794 Long term (current) use of insulin: Secondary | ICD-10-CM | POA: Diagnosis not present

## 2024-10-29 DIAGNOSIS — E119 Type 2 diabetes mellitus without complications: Secondary | ICD-10-CM | POA: Diagnosis not present

## 2024-10-29 DIAGNOSIS — H04123 Dry eye syndrome of bilateral lacrimal glands: Secondary | ICD-10-CM | POA: Diagnosis not present

## 2024-10-29 DIAGNOSIS — H2513 Age-related nuclear cataract, bilateral: Secondary | ICD-10-CM | POA: Diagnosis not present

## 2024-10-29 DIAGNOSIS — Z794 Long term (current) use of insulin: Secondary | ICD-10-CM | POA: Diagnosis not present
